# Patient Record
Sex: Male | Born: 1965 | Race: White | Hispanic: No | Marital: Married | Smoking: Never smoker
Health system: Southern US, Community
[De-identification: ages and names within clinical notes are randomized; demographics above are authoritative.]

## PROBLEM LIST (undated history)

## (undated) DIAGNOSIS — K219 Gastro-esophageal reflux disease without esophagitis: Secondary | ICD-10-CM

## (undated) DIAGNOSIS — E785 Hyperlipidemia, unspecified: Secondary | ICD-10-CM

## (undated) DIAGNOSIS — N200 Calculus of kidney: Secondary | ICD-10-CM

## (undated) HISTORY — DX: Gastro-esophageal reflux disease without esophagitis: K21.9

## (undated) HISTORY — DX: Calculus of kidney: N20.0

## (undated) HISTORY — PX: CHOLECYSTECTOMY, LAPAROSCOPIC: SHX56

## (undated) HISTORY — DX: Hyperlipidemia, unspecified: E78.5

---

## 2002-07-15 ENCOUNTER — Ambulatory Visit (HOSPITAL_COMMUNITY): Admission: RE | Admit: 2002-07-15 | Discharge: 2002-07-15 | Payer: Self-pay | Admitting: Family Medicine

## 2002-07-15 ENCOUNTER — Encounter: Payer: Self-pay | Admitting: Family Medicine

## 2002-12-15 ENCOUNTER — Encounter: Payer: Self-pay | Admitting: Gastroenterology

## 2002-12-15 ENCOUNTER — Ambulatory Visit (HOSPITAL_COMMUNITY): Admission: RE | Admit: 2002-12-15 | Discharge: 2002-12-15 | Payer: Self-pay | Admitting: Gastroenterology

## 2007-06-05 ENCOUNTER — Ambulatory Visit: Payer: Self-pay | Admitting: Internal Medicine

## 2007-06-05 LAB — CONVERTED CEMR LAB
Bilirubin Urine: NEGATIVE
Blood in Urine, dipstick: NEGATIVE
Glucose, Urine, Semiquant: NEGATIVE
Ketones, urine, test strip: NEGATIVE
Nitrite: NEGATIVE
Specific Gravity, Urine: 1.025
Urobilinogen, UA: 0.2
WBC Urine, dipstick: NEGATIVE
pH: 6

## 2007-06-09 LAB — CONVERTED CEMR LAB
ALT: 39 units/L (ref 0–53)
AST: 26 units/L (ref 0–37)
Albumin: 4.5 g/dL (ref 3.5–5.2)
Alkaline Phosphatase: 69 units/L (ref 39–117)
BUN: 13 mg/dL (ref 6–23)
Basophils Absolute: 0 10*3/uL (ref 0.0–0.1)
Basophils Relative: 0 % (ref 0.0–1.0)
Bilirubin, Direct: 0.3 mg/dL (ref 0.0–0.3)
CO2: 32 meq/L (ref 19–32)
Calcium: 9.8 mg/dL (ref 8.4–10.5)
Chloride: 104 meq/L (ref 96–112)
Cholesterol: 159 mg/dL (ref 0–200)
Creatinine, Ser: 1 mg/dL (ref 0.4–1.5)
Eosinophils Absolute: 0.1 10*3/uL (ref 0.0–0.6)
Eosinophils Relative: 1.9 % (ref 0.0–5.0)
GFR calc Af Amer: 106 mL/min
GFR calc non Af Amer: 88 mL/min
Glucose, Bld: 98 mg/dL (ref 70–99)
HCT: 47.5 % (ref 39.0–52.0)
HDL: 29.6 mg/dL — ABNORMAL LOW (ref >39.0)
Hemoglobin: 16.6 g/dL (ref 13.0–17.0)
LDL Cholesterol: 107 mg/dL — ABNORMAL HIGH (ref 0–99)
Lymphocytes Relative: 26.5 % (ref 12.0–46.0)
MCHC: 35 g/dL (ref 30.0–36.0)
MCV: 92.7 fL (ref 78.0–100.0)
Monocytes Absolute: 0.4 10*3/uL (ref 0.2–0.7)
Monocytes Relative: 8.3 % (ref 3.0–11.0)
Neutro Abs: 3 10*3/uL (ref 1.4–7.7)
Neutrophils Relative %: 63.3 % (ref 43.0–77.0)
Platelets: 205 10*3/uL (ref 150–400)
Potassium: 4.6 meq/L (ref 3.5–5.1)
RBC: 5.12 M/uL (ref 4.22–5.81)
RDW: 12.4 % (ref 11.5–14.6)
Sodium: 143 meq/L (ref 135–145)
TSH: 1.56 microintl units/mL (ref 0.35–5.50)
Total Bilirubin: 1.3 mg/dL — ABNORMAL HIGH (ref 0.3–1.2)
Total CHOL/HDL Ratio: 5.4
Total Protein: 7.4 g/dL (ref 6.0–8.3)
Triglycerides: 111 mg/dL (ref 0–149)
VLDL: 22 mg/dL (ref 0–40)
WBC: 4.8 10*3/uL (ref 4.5–10.5)

## 2007-06-11 ENCOUNTER — Ambulatory Visit: Payer: Self-pay | Admitting: Internal Medicine

## 2007-06-11 DIAGNOSIS — E785 Hyperlipidemia, unspecified: Secondary | ICD-10-CM | POA: Insufficient documentation

## 2007-06-11 DIAGNOSIS — K219 Gastro-esophageal reflux disease without esophagitis: Secondary | ICD-10-CM | POA: Insufficient documentation

## 2007-07-15 ENCOUNTER — Ambulatory Visit: Payer: Self-pay | Admitting: Internal Medicine

## 2007-07-15 DIAGNOSIS — J029 Acute pharyngitis, unspecified: Secondary | ICD-10-CM | POA: Insufficient documentation

## 2007-07-15 LAB — CONVERTED CEMR LAB: Rapid Strep: NEGATIVE

## 2008-06-16 ENCOUNTER — Ambulatory Visit: Payer: Self-pay | Admitting: Internal Medicine

## 2008-06-16 DIAGNOSIS — M25569 Pain in unspecified knee: Secondary | ICD-10-CM | POA: Insufficient documentation

## 2008-06-16 DIAGNOSIS — M2550 Pain in unspecified joint: Secondary | ICD-10-CM | POA: Insufficient documentation

## 2008-06-16 LAB — CONVERTED CEMR LAB: Anti Nuclear Antibody(ANA): NEGATIVE

## 2008-06-22 ENCOUNTER — Encounter: Payer: Self-pay | Admitting: Internal Medicine

## 2008-06-22 LAB — CONVERTED CEMR LAB
AST: 27 units/L (ref 0–37)
Alkaline Phosphatase: 67 units/L (ref 39–117)
Basophils Absolute: 0 10*3/uL (ref 0.0–0.1)
Lymphocytes Relative: 25.4 % (ref 12.0–46.0)
MCHC: 35.5 g/dL (ref 30.0–36.0)
Neutrophils Relative %: 63 % (ref 43.0–77.0)
RBC: 5.13 M/uL (ref 4.22–5.81)
RDW: 12.1 % (ref 11.5–14.6)
Total Bilirubin: 1.2 mg/dL (ref 0.3–1.2)

## 2008-06-24 ENCOUNTER — Encounter: Payer: Self-pay | Admitting: Internal Medicine

## 2008-08-17 DIAGNOSIS — N200 Calculus of kidney: Secondary | ICD-10-CM

## 2008-08-17 HISTORY — DX: Calculus of kidney: N20.0

## 2008-09-12 ENCOUNTER — Emergency Department (HOSPITAL_COMMUNITY): Admission: EM | Admit: 2008-09-12 | Discharge: 2008-09-12 | Payer: Self-pay | Admitting: Emergency Medicine

## 2008-09-23 ENCOUNTER — Telehealth: Payer: Self-pay | Admitting: Internal Medicine

## 2008-09-26 ENCOUNTER — Emergency Department (HOSPITAL_COMMUNITY): Admission: EM | Admit: 2008-09-26 | Discharge: 2008-09-27 | Payer: Self-pay | Admitting: Emergency Medicine

## 2008-10-02 ENCOUNTER — Encounter: Payer: Self-pay | Admitting: Internal Medicine

## 2009-02-02 ENCOUNTER — Telehealth: Payer: Self-pay | Admitting: Internal Medicine

## 2009-03-19 ENCOUNTER — Telehealth: Payer: Self-pay | Admitting: Internal Medicine

## 2009-04-06 ENCOUNTER — Ambulatory Visit: Payer: Self-pay | Admitting: Internal Medicine

## 2009-04-06 DIAGNOSIS — M25519 Pain in unspecified shoulder: Secondary | ICD-10-CM | POA: Insufficient documentation

## 2009-04-06 DIAGNOSIS — L608 Other nail disorders: Secondary | ICD-10-CM | POA: Insufficient documentation

## 2009-04-07 ENCOUNTER — Encounter: Payer: Self-pay | Admitting: Internal Medicine

## 2009-04-19 ENCOUNTER — Ambulatory Visit: Payer: Self-pay | Admitting: Internal Medicine

## 2009-04-19 LAB — CONVERTED CEMR LAB
Albumin: 4.3 g/dL (ref 3.5–5.2)
Alkaline Phosphatase: 61 units/L (ref 39–117)
Basophils Absolute: 0 10*3/uL (ref 0.0–0.1)
CO2: 32 meq/L (ref 19–32)
Calcium: 9.1 mg/dL (ref 8.4–10.5)
Creatinine, Ser: 1.1 mg/dL (ref 0.4–1.5)
Eosinophils Absolute: 0.1 10*3/uL (ref 0.0–0.7)
Glucose, Bld: 103 mg/dL — ABNORMAL HIGH (ref 70–99)
Glucose, Urine, Semiquant: NEGATIVE
HDL: 33.3 mg/dL — ABNORMAL LOW (ref >39.00)
Hemoglobin: 15.4 g/dL (ref 13.0–17.0)
Ketones, urine, test strip: NEGATIVE
Lymphocytes Relative: 29.8 % (ref 12.0–46.0)
MCHC: 35.9 g/dL (ref 30.0–36.0)
Monocytes Absolute: 0.4 10*3/uL (ref 0.1–1.0)
Neutro Abs: 2.3 10*3/uL (ref 1.4–7.7)
RDW: 12.5 % (ref 11.5–14.6)
Sodium: 147 meq/L — ABNORMAL HIGH (ref 135–145)
Specific Gravity, Urine: >=1.03
TSH: 1.91 microintl units/mL (ref 0.35–5.50)
Triglycerides: 60 mg/dL (ref 0.0–149.0)
pH: 5

## 2009-04-28 ENCOUNTER — Ambulatory Visit: Payer: Self-pay | Admitting: Internal Medicine

## 2009-04-28 DIAGNOSIS — D485 Neoplasm of uncertain behavior of skin: Secondary | ICD-10-CM | POA: Insufficient documentation

## 2009-04-28 DIAGNOSIS — R3129 Other microscopic hematuria: Secondary | ICD-10-CM | POA: Insufficient documentation

## 2009-04-28 DIAGNOSIS — Z87442 Personal history of urinary calculi: Secondary | ICD-10-CM | POA: Insufficient documentation

## 2009-04-28 DIAGNOSIS — H9319 Tinnitus, unspecified ear: Secondary | ICD-10-CM | POA: Insufficient documentation

## 2009-04-28 LAB — CONVERTED CEMR LAB
Glucose, Urine, Semiquant: NEGATIVE
Specific Gravity, Urine: >=1.03
Urobilinogen, UA: 0.2
pH: 5.5

## 2009-06-08 ENCOUNTER — Encounter: Payer: Self-pay | Admitting: Internal Medicine

## 2009-07-13 ENCOUNTER — Encounter: Payer: Self-pay | Admitting: Internal Medicine

## 2010-07-17 LAB — CONVERTED CEMR LAB
Cholesterol, target level: 200 mg/dL
HDL goal, serum: 40 mg/dL
LDL Goal: 160 mg/dL

## 2010-07-19 NOTE — Letter (Signed)
Summary: [facility]  [facility]   Imported By: Maryln Gottron 06/23/2009 14:18:50  _____________________________________________________________________  External Attachment:    Type:   Image     Comment:   External Document

## 2010-07-19 NOTE — Letter (Signed)
Summary: [facility]  [facility]   Imported By: Maryln Gottron 07/15/2009 13:49:45  _____________________________________________________________________  External Attachment:    Type:   Image     Comment:   External Document

## 2010-08-31 ENCOUNTER — Ambulatory Visit (INDEPENDENT_AMBULATORY_CARE_PROVIDER_SITE_OTHER): Payer: 59 | Admitting: Internal Medicine

## 2010-08-31 ENCOUNTER — Encounter: Payer: Self-pay | Admitting: Internal Medicine

## 2010-08-31 VITALS — BP 110/70 | HR 78 | Temp 98.5°F | Wt 201.0 lb

## 2010-08-31 DIAGNOSIS — R1013 Epigastric pain: Secondary | ICD-10-CM

## 2010-08-31 NOTE — Patient Instructions (Signed)
This may be gastritis.  Continue the  prilosec for the full 14 days. If the pain recurs then  Restart and return for evaluation.  If pain is severe we can consider further  Evaluation. Minimize caffeine carbonation and alcohol Gastritis Gastritis is an irritation of the stomach. This is often caused by medications, but can be from anything that bothers the stomach.  Other stomach irritants are:  Alcohol.  Caffeine.   Nicotine.   Spicy or acid foods.   Medications for pain and arthritis. Aspirin and other anti-inflammatory medicines such as ibuprofen (Advil), naproxen (Aleve), and ketoprofen (Orudis) can be highly irritating.  Emotional distress.   Symptoms of gastritis may include:  Abdominal pain.  Indigestion.   Nausea and or vomiting.  Bleeding.   Some patients with chronic gastritis and ulcers have been infected by a germ. They may need special testing. Medications which kill germs can be used to cure this condition. Treatment includes avoiding the substances mentioned above that are known to cause stomach trouble. Medications used to treat gastritis can include:  Antacids.  Medicines to control vomiting.   Acid blocking medicines.   Symptoms of gastritis usually improve within 2-3 days of starting treatment. Call your caregiver if you are not better in a few days. SEEK MEDICAL CARE IF YOU:   Have increased stomach or chest pain.  Vomit blood.   Faint or feel light headed.   Can not keep fluids down.  Pass bloody or black stools.   Develop severe back pain.   MAKE SURE YOU:   Understand these instructions.   Will watch your condition.   Will get help right away if you are not doing well or get worse.  Document Released: 06/05/2005 Document Re-Released: 09/01/2008 [facility] Patient Information 2011 [place], [place].

## 2010-09-03 NOTE — Progress Notes (Signed)
  Subjective:    Patient ID: Roxy Cedar, male    DOB: 11/23/1965, 45 y.o.   MRN: [id number]  HPI Patient comes in today for acute visit with his wife. He was in his usual state of health until about five days ago when he had the onset of what he thought was food poisoning with epigastric pain and discomfort after eating [place] food. He never had any nausea or vomiting or change in bowel habits. However he had continued difficulty with some nausea burping no radiation these symptoms are worse when he lays down and are just about gone when he's in the upright position he tried times without success. He has used Prilosec for three days and is noticed improvement. He does not get the pain when he lays down every time. He denies dysphasia weight-loss fever blood in his stool.  He had a remote history with sounded like reflux symptoms in the past but has not been on medication recently no Advil Aleve type medicines right now.  No family history of gallbladder disease Past Medical History  Diagnosis Date  . Hyperlipidemia   . GERD (gastroesophageal reflux disease)   . Nephrolithiasis 08/2008    left   History reviewed. No pertinent past surgical history.  reports that he has never smoked. He does not have any smokeless tobacco history on file. He reports that he drinks alcohol. He reports that he does not use illicit drugs. family history includes Cancer in his mother. Allergies  Allergen Reactions  . Sulfonamide Derivatives      Review of Systems No fever weight loss night sweats vomiting bruising bleeding cough GU problems  Has decreased caffeine recently     Objective:   Physical Exam This is a well-developed well-nourished healthy appearing in no acute distress HEENT: Normocephalic ;atraumatic , Eyes;  PERRL, EOMs  Full, lids and conjunctiva clear,,Ears: no deformities, canals nl, TM landmarks normal, Nose: no deformity or discharge  Mouth : OP clear without lesion or edema . Neck -  No masses or thyromegaly or limitation in range of motion  Chest:  Clear to A&P without wheezes rales or rhonchi CV:  S1-S2 no gallops or murmurs peripheral perfusion is normal Abdomen:  Sof,t normal bowel sounds without hepatosplenomegaly, no guarding rebound or masses no CVA tenderness points to the epigastrum area of discomfort but no  g or r  Neuro non focal  Ms clear  Skin no acute change     Assessment & Plan:  Abd pain  ? Cause  Unusual with inc with supine position  Poss acid related disease  Discussed at length differential diagnosis and alarm findings because his exam is good today and it is getting some better we will continue on the acid blocking medication and follow-up if not improved. Consider imaging or other valuation if not better. He will continue on a decreased caffeine diet as he is doing and avoid anti-inflammatories.  More than 50% of visit  Was spent in counseling  25 minutes

## 2010-09-04 ENCOUNTER — Emergency Department (HOSPITAL_COMMUNITY)
Admission: EM | Admit: 2010-09-04 | Discharge: 2010-09-05 | Disposition: A | Discharge status: Home / Self Care | Payer: 59 | Attending: Emergency Medicine | Admitting: Emergency Medicine

## 2010-09-04 ENCOUNTER — Emergency Department (HOSPITAL_COMMUNITY): Payer: 59

## 2010-09-04 DIAGNOSIS — R112 Nausea with vomiting, unspecified: Secondary | ICD-10-CM | POA: Insufficient documentation

## 2010-09-04 DIAGNOSIS — R748 Abnormal levels of other serum enzymes: Secondary | ICD-10-CM | POA: Insufficient documentation

## 2010-09-04 DIAGNOSIS — Z87442 Personal history of urinary calculi: Secondary | ICD-10-CM | POA: Insufficient documentation

## 2010-09-04 DIAGNOSIS — K219 Gastro-esophageal reflux disease without esophagitis: Secondary | ICD-10-CM | POA: Insufficient documentation

## 2010-09-04 DIAGNOSIS — R109 Unspecified abdominal pain: Secondary | ICD-10-CM | POA: Insufficient documentation

## 2010-09-04 LAB — CBC
Hemoglobin: 16.8 g/dL (ref 13.0–17.0)
MCH: 31.5 pg (ref 26.0–34.0)
MCHC: 35 g/dL (ref 30.0–36.0)
MCV: 90.1 fL (ref 78.0–100.0)
Platelets: 196 10*3/uL (ref 150–400)
RBC: 5.33 MIL/uL (ref 4.22–5.81)

## 2010-09-04 LAB — DIFFERENTIAL
Basophils Absolute: 0 10*3/uL (ref 0.0–0.1)
Basophils Relative: 0 % (ref 0–1)
Eosinophils Absolute: 0 10*3/uL (ref 0.0–0.7)
Neutro Abs: 6.1 10*3/uL (ref 1.7–7.7)
Neutrophils Relative %: 81 % — ABNORMAL HIGH (ref 43–77)

## 2010-09-04 LAB — COMPREHENSIVE METABOLIC PANEL
BUN: 12 mg/dL (ref 6–23)
CO2: 30 mEq/L (ref 19–32)
Calcium: 9.9 mg/dL (ref 8.4–10.5)
Chloride: 104 mEq/L (ref 96–112)
Creatinine, Ser: 1.06 mg/dL (ref 0.4–1.5)
GFR calc Af Amer: >60 mL/min (ref >60)
GFR calc non Af Amer: >60 mL/min (ref >60)
Total Bilirubin: 2.6 mg/dL — ABNORMAL HIGH (ref 0.3–1.2)

## 2010-09-08 ENCOUNTER — Encounter: Payer: Self-pay | Admitting: Internal Medicine

## 2010-09-08 ENCOUNTER — Ambulatory Visit (INDEPENDENT_AMBULATORY_CARE_PROVIDER_SITE_OTHER): Payer: 59 | Admitting: Internal Medicine

## 2010-09-08 VITALS — BP 130/70 | HR 72 | Wt 199.0 lb

## 2010-09-08 DIAGNOSIS — K8020 Calculus of gallbladder without cholecystitis without obstruction: Secondary | ICD-10-CM

## 2010-09-08 DIAGNOSIS — R1013 Epigastric pain: Secondary | ICD-10-CM

## 2010-09-08 DIAGNOSIS — R7989 Other specified abnormal findings of blood chemistry: Secondary | ICD-10-CM

## 2010-09-08 DIAGNOSIS — K828 Other specified diseases of gallbladder: Secondary | ICD-10-CM | POA: Insufficient documentation

## 2010-09-08 DIAGNOSIS — R945 Abnormal results of liver function studies: Secondary | ICD-10-CM | POA: Insufficient documentation

## 2010-09-08 LAB — POCT URINALYSIS DIPSTICK
Glucose, UA: NEGATIVE
Leukocytes, UA: NEGATIVE
Nitrite, UA: NEGATIVE
Spec Grav, UA: 1.02
Urobilinogen, UA: 1

## 2010-09-08 LAB — HEPATIC FUNCTION PANEL
Alkaline Phosphatase: 124 U/L — ABNORMAL HIGH (ref 39–117)
Bilirubin, Direct: 0.3 mg/dL (ref 0.0–0.3)
Total Bilirubin: 1.5 mg/dL — ABNORMAL HIGH (ref 0.3–1.2)

## 2010-09-08 NOTE — Progress Notes (Signed)
  Subjective:    Patient ID: Kenneth [name], male    DOB: 1966-01-17, 45 y.o.   MRN: [id number]  HPI  Patient comes in with his wife today 4 follow  up from hospital visit for recurrence of this severe epigastric pain. He was seen a week or so ago NB diagnosed him as possible gastritis . Acid peptic disease considering gallbladder disease is a possibility.  At that time he had discomfort only when laying down.   NPT and having increasing persistent severe epigastric pain with nausea over the weekend and eventually was seen in the emergency room on  March 18.   At that time blood work showed a normal CBC normal lipase and chemistry however AST was 599 ALT 813 with a normal alkaline phosphatase and a slightly elevated bili drip. Abdominal ultrasound showed gallbladder sludge and probable small stones but no evidence of acute cholecystitis or biliary ductal dilatation. He was treated with Zofran an IV.at and improved his symptoms. He eventually went home and since that time the pain is better   But he is eating very light and low-fat diet. He has had some constipation since that time but no bleeding.   no known exposures.  Past Medical History  Diagnosis Date  . Hyperlipidemia   . GERD (gastroesophageal reflux disease)   . Nephrolithiasis 08/2008    left   No past surgical history on file.  reports that he has never smoked. He does not have any smokeless tobacco history on file. He reports that he drinks alcohol. He reports that he does not use illicit drugs. family history includes Cancer in his mother. Allergies  Allergen Reactions  . Sulfonamide Derivatives     Review of Systems  negative fever or current vomiting diarrhea blood in stool rash is cough or new symptoms.  Rest of ROS no change    Objective:   Physical Exam  well-developed well-nourished in no acute distress.    Skin nonicteric HEENT normal grossly  Respirations nonlabored cardiac S1-S2 no gallops or murmurs   Abdomen soft  without organomegaly no guarding or rebound today he points to the mid epigastrium   No CVA pain   Reviewed hospital record from a chart    Assessment & Plan:   epigastric pain recurrent acute sludge in the gallbladder and elevated transaminases.   There was no ductal dilatation I still have a concern the gallbladder as the cause of his symptomatology. His pancreas enzymes  lipase were normal. He is not on anti-inflammatories nor any   Hepatotoxin.     For the constipation try white grape juice no high-fiber for now we'll do  Conservative treatment.

## 2010-09-08 NOTE — Patient Instructions (Signed)
Will notify you  of labs when available.  We are going to do a  Gi and surgical referral. IN the meantime stay on meds and avoid fatty foods eat light.

## 2010-09-08 NOTE — Progress Notes (Signed)
Addended by: Rita Ohara on: 09/08/2010 01:38 PM   Modules accepted: Orders

## 2010-09-09 LAB — HEPATITIS PANEL, ACUTE
HCV Ab: NEGATIVE
Hep A IgM: NEGATIVE
Hep B C IgM: NEGATIVE

## 2010-09-12 ENCOUNTER — Telehealth: Payer: Self-pay | Admitting: Gastroenterology

## 2010-09-12 NOTE — Progress Notes (Signed)
Pt's wife aware of results

## 2010-09-12 NOTE — Telephone Encounter (Signed)
Spoke with patients wife and gave her the appointment  For 09/13/10 at 2:30 PM with Willette Cluster, NP.

## 2010-09-13 ENCOUNTER — Encounter: Payer: Self-pay | Admitting: Nurse Practitioner

## 2010-09-13 ENCOUNTER — Ambulatory Visit (INDEPENDENT_AMBULATORY_CARE_PROVIDER_SITE_OTHER): Payer: 59 | Admitting: Nurse Practitioner

## 2010-09-13 ENCOUNTER — Encounter: Payer: Self-pay | Admitting: Gastroenterology

## 2010-09-13 DIAGNOSIS — R945 Abnormal results of liver function studies: Secondary | ICD-10-CM

## 2010-09-13 DIAGNOSIS — K219 Gastro-esophageal reflux disease without esophagitis: Secondary | ICD-10-CM

## 2010-09-13 DIAGNOSIS — R7989 Other specified abnormal findings of blood chemistry: Secondary | ICD-10-CM

## 2010-09-13 DIAGNOSIS — K5900 Constipation, unspecified: Secondary | ICD-10-CM | POA: Insufficient documentation

## 2010-09-13 DIAGNOSIS — R933 Abnormal findings on diagnostic imaging of other parts of digestive tract: Secondary | ICD-10-CM

## 2010-09-13 DIAGNOSIS — R1010 Upper abdominal pain, unspecified: Secondary | ICD-10-CM

## 2010-09-13 DIAGNOSIS — R109 Unspecified abdominal pain: Secondary | ICD-10-CM

## 2010-09-13 MED ORDER — ESOMEPRAZOLE MAGNESIUM 40 MG PO CPDR: 40.0000 mg | DELAYED_RELEASE_CAPSULE | Freq: Every day | ORAL | Status: AC

## 2010-09-13 NOTE — Patient Instructions (Signed)
We have scheduled the Endoscopy with Dr. Russella Dar on 09-16-2010. Directions and brochure provided. We have given you samples of Nexium to try. Take 1 capsule 30 min before breakfast.

## 2010-09-14 ENCOUNTER — Encounter: Payer: Self-pay | Admitting: Nurse Practitioner

## 2010-09-14 NOTE — Assessment & Plan Note (Signed)
Ultrasound negative for evidence of acute cholecystitis or CBD dilation.

## 2010-09-14 NOTE — Progress Notes (Signed)
Kenneth [name] [id number] 1966/03/19   History of Present Illness:  Kenneth [name] is a 45 year old male who, in 2004, was evaluated by Dr. Russella Dar for chest pain and reflux symptoms. He underwent an upper endoscopy which was normal. He has been fairly asymptomatic for the last several years by managing his diet. Kenneth [name] comes in with his wife for evaluation of abdominal pain. Two weeks ago the patient developed epigastric pain radiating through to his back. He went to the emergency department for evaluation where an ultrasound of the abdomen revealed gallbladder sludge and probable small stones without evidence of acute cholecystitis. CBD was normal. CBC was normal. Total bilirubin was elevated at 2.6, AST 599, ALT 813. His alk phos was normal. Repeat LFTs by Dr. Fabian Sharp four days later showed drastic improvement. His bilirubin had declined to 1.5, AST down to 75 and ALT down to 360. Alkaline phosphatase was 12. Patient started on a proton inhibitor and felt better for one week but then had recurrent pain. His upper abdominal pain has been sporadic over the last few days but not nearly as bad as when he went to the emergency department. After receiving narcotics in the emergency room patient had some nausea and vomiting but none prior to or since then.  Past Medical History  Diagnosis Date  . Hyperlipidemia   . GERD (gastroesophageal reflux disease)   . Nephrolithiasis 08/2008    left   History reviewed. No pertinent past surgical history.  reports that he has never smoked. He does not have any smokeless tobacco history on file. He reports that he drinks alcohol. He reports that he does not use illicit drugs. family history includes Cancer in his mother.  There is no history of Colon cancer. Allergies  Allergen Reactions  . Sulfonamide Derivatives Rash    Medications: Prilosec OTC 20mg  daily Pepcid AC 10mg  twice daily   Allergies  Allergen Reactions  . Sulfonamide Derivatives Rash      ROS: All systems reviewed and negative except where noted in the history of present illness.  Physical Exam: General: Well developed white male in no acute distress Head: Normocephalic and atraumatic Eyes:  sclerae anicteric,conjunctive pink. Ears: Normal auditory acuity Mouth: No deformity or lesions Neck: Supple, no masses.  Lungs: Clear throughout to auscultation Heart: Regular rate and rhythm; no murmurs heard Abdomen: Soft, non tender and non distended. No masses or hepatomegaly noted. Normal Bowel sounds Rectal: Deferred Musculoskeletal: Symmetrical with no gross deformities  Skin: No lesions on visible extremities Extremities: No edema or deformities noted Neurological: Alert oriented x 4, grossly nonfocal Cervical Nodes:  No significant cervical adenopathy Psychological:  Alert and cooperative. Normal mood and affect  Assessment and Plan:   .rev

## 2010-09-14 NOTE — Assessment & Plan Note (Signed)
Two week history of constipation. Etiology not clear, he isn't on any pain medications. Trial of Miralax. If constipation persists he will need further evaluation once acute biliary issue resolve.

## 2010-09-14 NOTE — Assessment & Plan Note (Addendum)
Patient has a known history of GERD but had been doing fairly well off medications. Now on a PPI and an H2 blocker. His recent upper abdominal pain probably biliary in nature but it is reasonable to continue a PPI until we prove otherwise. Can stop Pepcid. Samples of Nexium given.

## 2010-09-14 NOTE — Assessment & Plan Note (Addendum)
Intermittent episodes of radiating epigastric pain over the last couple of weeks in setting of gallbladder sludge (possibly small stones), marked transaminitis and,mildly elevated bilirubin (both have improved). Common bile duct was normal. PCP referred patient to [place] as well as surgery. Patient initially felt better after starting a PPI but pain has recurred. My initial thought was to schedule patient for an upper endoscopy to rule out any other possible causes of upper abdominal pain but the overall picture seems most consistent with a passed gallstone so will await surgery's input.

## 2010-09-14 NOTE — Assessment & Plan Note (Addendum)
Improving. Suspect secondary to passed gallstone. Hepatitis A,B, and C studies negative.

## 2010-09-15 NOTE — Progress Notes (Signed)
Probably biliary issue.  I agree with general surgery visit.  He will need lap chole +/- ERCP depending on labs, IOC, clincal course

## 2010-09-16 ENCOUNTER — Other Ambulatory Visit: Payer: 59 | Admitting: Gastroenterology

## 2010-09-28 LAB — URINALYSIS, ROUTINE W REFLEX MICROSCOPIC
Glucose, UA: NEGATIVE mg/dL
Leukocytes, UA: NEGATIVE
Protein, ur: 100 mg/dL — AB
Specific Gravity, Urine: 1.036 — ABNORMAL HIGH (ref 1.005–1.030)
Urobilinogen, UA: 0.2 mg/dL (ref 0.0–1.0)

## 2010-09-28 LAB — DIFFERENTIAL
Eosinophils Absolute: 0 10*3/uL (ref 0.0–0.7)
Lymphs Abs: 0.8 10*3/uL (ref 0.7–4.0)
Neutro Abs: 8.4 10*3/uL — ABNORMAL HIGH (ref 1.7–7.7)
Neutrophils Relative %: 83 % — ABNORMAL HIGH (ref 43–77)

## 2010-09-28 LAB — POCT I-STAT, CHEM 8
Chloride: 104 mEq/L (ref 96–112)
Creatinine, Ser: 1.4 mg/dL (ref 0.4–1.5)
Glucose, Bld: 133 mg/dL — ABNORMAL HIGH (ref 70–99)
Potassium: 4.3 mEq/L (ref 3.5–5.1)

## 2010-09-28 LAB — URINE MICROSCOPIC-ADD ON

## 2010-09-28 LAB — CBC
MCV: 92.7 fL (ref 78.0–100.0)
Platelets: 254 10*3/uL (ref 150–400)
RBC: 4.96 MIL/uL (ref 4.22–5.81)
WBC: 10.1 10*3/uL (ref 4.0–10.5)

## 2010-09-29 LAB — COMPREHENSIVE METABOLIC PANEL
Alkaline Phosphatase: 68 U/L (ref 39–117)
BUN: 14 mg/dL (ref 6–23)
Calcium: 9.1 mg/dL (ref 8.4–10.5)
Creatinine, Ser: 0.87 mg/dL (ref 0.4–1.5)
Glucose, Bld: 103 mg/dL — ABNORMAL HIGH (ref 70–99)
Total Protein: 6.5 g/dL (ref 6.0–8.3)

## 2010-09-29 LAB — DIFFERENTIAL
Basophils Relative: 0 % (ref 0–1)
Lymphs Abs: 0.8 10*3/uL (ref 0.7–4.0)
Monocytes Relative: 9 % (ref 3–12)
Neutro Abs: 5 10*3/uL (ref 1.7–7.7)
Neutrophils Relative %: 79 % — ABNORMAL HIGH (ref 43–77)

## 2010-09-29 LAB — URINE MICROSCOPIC-ADD ON

## 2010-09-29 LAB — URINALYSIS, ROUTINE W REFLEX MICROSCOPIC
Specific Gravity, Urine: 1.026 (ref 1.005–1.030)
Urobilinogen, UA: 0.2 mg/dL (ref 0.0–1.0)

## 2010-09-29 LAB — CBC
HCT: 45.2 % (ref 39.0–52.0)
Hemoglobin: 15.7 g/dL (ref 13.0–17.0)
MCHC: 34.7 g/dL (ref 30.0–36.0)
RDW: 13 % (ref 11.5–15.5)

## 2010-09-30 ENCOUNTER — Other Ambulatory Visit: Payer: Self-pay | Admitting: Surgery

## 2010-09-30 ENCOUNTER — Other Ambulatory Visit (HOSPITAL_COMMUNITY): Payer: 59

## 2010-09-30 ENCOUNTER — Encounter (HOSPITAL_COMMUNITY): Payer: 59

## 2010-09-30 DIAGNOSIS — Z01810 Encounter for preprocedural cardiovascular examination: Secondary | ICD-10-CM | POA: Insufficient documentation

## 2010-09-30 DIAGNOSIS — Z01812 Encounter for preprocedural laboratory examination: Secondary | ICD-10-CM | POA: Insufficient documentation

## 2010-09-30 DIAGNOSIS — Z01818 Encounter for other preprocedural examination: Secondary | ICD-10-CM | POA: Insufficient documentation

## 2010-09-30 LAB — CBC
MCH: 30.8 pg (ref 26.0–34.0)
MCHC: 34.3 g/dL (ref 30.0–36.0)
MCV: 89.9 fL (ref 78.0–100.0)
Platelets: 185 10*3/uL (ref 150–400)
RDW: 12.6 % (ref 11.5–15.5)
WBC: 5.1 10*3/uL (ref 4.0–10.5)

## 2010-09-30 LAB — BASIC METABOLIC PANEL
BUN: 20 mg/dL (ref 6–23)
Calcium: 9.5 mg/dL (ref 8.4–10.5)
Creatinine, Ser: 0.99 mg/dL (ref 0.4–1.5)
GFR calc non Af Amer: >60 mL/min (ref >60)
Potassium: 3.7 mEq/L (ref 3.5–5.1)

## 2010-09-30 LAB — SURGICAL PCR SCREEN: Staphylococcus aureus: NEGATIVE

## 2010-10-07 ENCOUNTER — Other Ambulatory Visit: Payer: Self-pay | Admitting: Surgery

## 2010-10-07 ENCOUNTER — Ambulatory Visit (HOSPITAL_COMMUNITY)
Admission: RE | Admit: 2010-10-07 | Discharge: 2010-10-07 | Disposition: A | Discharge status: Home / Self Care | Payer: 59 | Source: Ambulatory Visit | Attending: Surgery | Admitting: Surgery

## 2010-10-07 ENCOUNTER — Ambulatory Visit (HOSPITAL_COMMUNITY): Payer: 59

## 2010-10-07 DIAGNOSIS — Z01812 Encounter for preprocedural laboratory examination: Secondary | ICD-10-CM | POA: Insufficient documentation

## 2010-10-07 DIAGNOSIS — K219 Gastro-esophageal reflux disease without esophagitis: Secondary | ICD-10-CM | POA: Insufficient documentation

## 2010-10-07 DIAGNOSIS — R1011 Right upper quadrant pain: Secondary | ICD-10-CM | POA: Insufficient documentation

## 2010-10-07 DIAGNOSIS — K8020 Calculus of gallbladder without cholecystitis without obstruction: Secondary | ICD-10-CM | POA: Insufficient documentation

## 2010-10-07 DIAGNOSIS — Z01818 Encounter for other preprocedural examination: Secondary | ICD-10-CM | POA: Insufficient documentation

## 2010-10-12 NOTE — Op Note (Signed)
[name], [name] NO.:  [id number]  MEDICAL RECORD NO.:  [id number]           PATIENT TYPE:  O  LOCATION:  DAYL                         FACILITY:  [facility]  PHYSICIAN:  Abigail Miyamoto, M.D. DATE OF BIRTH:  1966-04-14  DATE OF PROCEDURE:  10/07/2010 DATE OF DISCHARGE:  10/07/2010                              OPERATIVE REPORT   PREOPERATIVE DIAGNOSIS:  Symptomatic cholelithiasis.  POSTOPERATIVE DIAGNOSIS:  Symptomatic cholelithiasis.  PROCEDURE:  Laparoscopic cholecystectomy with intraoperative cholangiogram.  SURGEON:  Abigail Miyamoto, M.D.  ASSISTANT:  Ollen Gross. Vernell Morgans, M.D.  ANESTHESIA:  General endotracheal anesthesia and 0.5% Marcaine.  ESTIMATED BLOOD LOSS:  Minimal.  FINDINGS:  The patient had a chronically scarred appearing gallbladder. Cholangiogram was normal.  PROCEDURE IN DETAIL:  The patient was brought to operative room, identified as Kenneth [name].  He was placed supine on the operative table and general anesthesia was induced.  His abdomen was then prepped and draped in usual sterile fashion.  Using #15 blade, a small vertical incision was made below the umbilicus.  This was carried down to fascia which was then opened with scalpel.  Hemostat was then used to pass to the peritoneal cavity under direct vision.  Next, 0 Vicryl pursestring suture was placed around fascial opening.  The Hasson port was placed through the opening and insufflation of the abdomen was begun.  A 5 mm port was placed in the patient's epigastrium and 2 more in the right upper quadrant, all under direct vision.  The gallbladder was then grasped and tracked above the liver bed.  It was found be thick-walled in appearance.  Multiple adhesions of the gallbladder were taken down bluntly.  The cystic artery was then dissected out and the critical window was achieved around both.  I clipped the artery 3 times proximally, once distally and then clipped the cystic duct  once distally and opened with laparoscopic scissors.  I made a small incision in the right upper quadrant with a scalpel and placed a small cholangiocatheter to this incision under direct vision.  I then placed a cholangiocatheter into the cystic duct.  I performed a cholangiogram with contrast under direct fluoroscopy.  This demonstrated the bile duct and duodenum without evidence of obstructing stone or abnormalities.  At this point, the cholangiocatheter was removed.  I clipped the cystic duct 3 times proximally, then transected it along with the cystic artery.  The gallbladder was then slowly dissected free from liver bed with electrocautery.  Once it was free from liver bed, it was removed through the incision at the umbilicus.  I again examined the liver bed and hemostasis was then achieved with cautery.  I irrigated the abdomen with normal saline.  Again, hemostasis appeared to be achieved.  All ports were then removed under direct vision.  The abdomen was deflated.  All incisions were anesthetized with Marcaine and closed with 4-0 Monocryl subcuticular sutures.  Steri-Strips and Band-Aids were then applied.  The patient tolerated the procedure well.  All counts were correct at the end of procedure.  The patient was then extubated in operating and taken in stable  condition to recovery room.     Abigail Miyamoto, M.D.     DB/MEDQ  D:  10/07/2010  T:  10/07/2010  Job:  [id number]  Electronically Signed by Abigail Miyamoto M.D. on 10/12/2010 11:57:40 AM

## 2011-01-30 ENCOUNTER — Other Ambulatory Visit (INDEPENDENT_AMBULATORY_CARE_PROVIDER_SITE_OTHER): Payer: 59

## 2011-01-30 DIAGNOSIS — Z0000 Encounter for general adult medical examination without abnormal findings: Secondary | ICD-10-CM

## 2011-01-30 LAB — CBC WITH DIFFERENTIAL/PLATELET
Basophils Absolute: 0 10*3/uL (ref 0.0–0.1)
Eosinophils Relative: 1.9 % (ref 0.0–5.0)
Monocytes Relative: 8.8 % (ref 3.0–12.0)
Neutrophils Relative %: 57.3 % (ref 43.0–77.0)
Platelets: 192 10*3/uL (ref 150.0–400.0)
RDW: 13.8 % (ref 11.5–14.6)
WBC: 5 10*3/uL (ref 4.5–10.5)

## 2011-01-30 LAB — LIPID PANEL
LDL Cholesterol: 89 mg/dL (ref 0–99)
Total CHOL/HDL Ratio: 4
Triglycerides: 62 mg/dL (ref 0.0–149.0)
VLDL: 12.4 mg/dL (ref 0.0–40.0)

## 2011-01-30 LAB — POCT URINALYSIS DIPSTICK
Ketones, UA: NEGATIVE
Leukocytes, UA: NEGATIVE
Protein, UA: NEGATIVE
Spec Grav, UA: 1.03
pH, UA: 5.5

## 2011-01-30 LAB — BASIC METABOLIC PANEL
BUN: 21 mg/dL (ref 6–23)
Calcium: 9.3 mg/dL (ref 8.4–10.5)
Creatinine, Ser: 1 mg/dL (ref 0.4–1.5)
GFR: 85.29 mL/min (ref >60.00)

## 2011-01-30 LAB — HEPATIC FUNCTION PANEL
ALT: 29 U/L (ref 0–53)
AST: 20 U/L (ref 0–37)
Bilirubin, Direct: 0.1 mg/dL (ref 0.0–0.3)
Total Bilirubin: 1.2 mg/dL (ref 0.3–1.2)

## 2011-02-06 ENCOUNTER — Ambulatory Visit (INDEPENDENT_AMBULATORY_CARE_PROVIDER_SITE_OTHER): Payer: 59 | Admitting: Internal Medicine

## 2011-02-06 ENCOUNTER — Encounter: Payer: Self-pay | Admitting: Internal Medicine

## 2011-02-06 VITALS — BP 110/60 | HR 72 | Ht 70.0 in | Wt 185.0 lb

## 2011-02-06 DIAGNOSIS — R3129 Other microscopic hematuria: Secondary | ICD-10-CM

## 2011-02-06 DIAGNOSIS — E785 Hyperlipidemia, unspecified: Secondary | ICD-10-CM

## 2011-02-06 DIAGNOSIS — L608 Other nail disorders: Secondary | ICD-10-CM

## 2011-02-06 DIAGNOSIS — H9319 Tinnitus, unspecified ear: Secondary | ICD-10-CM

## 2011-02-06 DIAGNOSIS — Z0000 Encounter for general adult medical examination without abnormal findings: Secondary | ICD-10-CM | POA: Insufficient documentation

## 2011-02-06 NOTE — Patient Instructions (Signed)
Your  Lipids are much better . Should be ok to  Take antifungal meds if appropriate at this time. Follow through with the eval for blood in urine Ear protection at work is eimportant . Continue lifestyle intervention healthy eating and exercise .

## 2011-02-06 NOTE — Progress Notes (Signed)
  Subjective:    Patient ID: Kenneth [name], male    DOB: [birth year], 45 y.o.   MRN: [id number]  HPI Patient comes in today for preventive visit and follow-up of medical issues. Update of  history since last visit. Totally better  After choly  No major illness now .   Tinnitus almost chronic  [employer]  works evenings  Uses ear plugs no monitoring of hearing at work   Thickened nail left  Ring  Told it is fungus infection    Review of Systems ROS:  GEN/ HEENTNo fever, significant weight changes sweats headaches vision problems hearing changes, CV/ PULM; No chest pain shortness of breath cough, syncope,edema  change in exercise tolerance. GI /GU: No adominal pain, vomiting, change in bowel habits. No blood in the stool. No significant GU symptoms. SKIN/HEME: ,no acute skin rashes suspicious lesions or bleeding. No lymphadenopathy, nodules, masses.  NEURO/ PSYCH:  No neurologic signs such as weakness numbness No depression anxiety. IMM/ Allergy: No unusual infections.  Allergy .   No left thumg tender with flexion nl function and no injury REST of 12 system review negative or as  Per hpi      Objective:   Physical Exam Physical Exam: Vital signs reviewed WUJ:WJXB is a well-developed well-nourished alert cooperative  White male  who appears   stated age in no acute distress.  HEENT: normocephalic  traumatic , Eyes: PERRL EOM's full, conjunctiva clear, Nares: patent no deformity discharge or tenderness., Ears: no deformity EAC's clear TMs with normal landmarks. Mouth: clear OP, no lesions, edema.  Moist mucous membranes. Dentition in adequate repair. NECK: supple without masses, thyromegaly or bruits. CHEST/PULM:  Clear to auscultation and percussion breath sounds equal no wheeze , rales or rhonchi. No chest wall deformities or tenderness. CV: PMI is nondisplaced, S1 S2 no gallops, murmurs, rubs. Peripheral pulses are full without delay.No JVD .  ABDOMEN: Bowel sounds normal  nontender  No guard or rebound, no hepato splenomegal no CVA tenderness.  No hernia. Extremtities:  No clubbing cyanosis or edema, no acute joint swelling or redness no focal atrophy   NEURO:  Oriented x3, cranial nerves 3-12 appear to be intact, no obvious focal weakness,gait within normal limits no abnormal reflexes or asymmetrical SKIN: No acute rashes normal turgor, color, no bruising or petechiae. Sun changes .   Left nail ring   Thickened .      PSYCH: Oriented, good eye contact, no obvious depression anxiety, cognition and judgment appear normal. LN:  No cervical axillary or inguinal adenopathy Labs reviewed with patient.      Assessment & Plan:  Preventive Health Care Counseled regarding healthy nutrition, exercise, sleep, injury prevention, calcium vit d and healthy weight . LIPID  Much better  Microscopic hematuria better  Under eval hx of stone also.  Onychomycosis  Was holding off on meds cause of lft abnormality but this resolved and was related to biliary disease Thumb right   Poss overuse  Vs OA  Tinnitus .    Job with noise exposure Counseled. About ear protection  Hearing  Screen ok today

## 2011-02-11 ENCOUNTER — Encounter: Payer: Self-pay | Admitting: Internal Medicine

## 2013-02-18 ENCOUNTER — Telehealth: Payer: Self-pay | Admitting: Internal Medicine

## 2013-02-18 NOTE — Telephone Encounter (Signed)
PT wife called and stated she would like to schedule her husbands physical, and it needs to be completed prior to the end of October. She states that his insurance is requiring that it's completed by then. Please assist.

## 2013-02-20 NOTE — Telephone Encounter (Signed)
Ok to use a Tuesday or a 12 noon appt on a Wed.  Please look at the Tues schedule to make sure there are no other 30 minute appointments that this will be scheduled next to.

## 2013-02-25 NOTE — Telephone Encounter (Signed)
lmovm / ga

## 2013-03-12 ENCOUNTER — Other Ambulatory Visit (INDEPENDENT_AMBULATORY_CARE_PROVIDER_SITE_OTHER): Payer: 59

## 2013-03-12 DIAGNOSIS — Z0000 Encounter for general adult medical examination without abnormal findings: Secondary | ICD-10-CM

## 2013-03-12 LAB — HEPATIC FUNCTION PANEL
ALT: 74 U/L — ABNORMAL HIGH (ref 0–53)
Alkaline Phosphatase: 74 U/L (ref 39–117)
Bilirubin, Direct: 0.1 mg/dL (ref 0.0–0.3)
Total Bilirubin: 0.9 mg/dL (ref 0.3–1.2)
Total Protein: 6.9 g/dL (ref 6.0–8.3)

## 2013-03-12 LAB — CBC WITH DIFFERENTIAL/PLATELET
Eosinophils Absolute: 0.1 10*3/uL (ref 0.0–0.7)
HCT: 44.8 % (ref 39.0–52.0)
Lymphs Abs: 1.4 10*3/uL (ref 0.7–4.0)
MCHC: 33.9 g/dL (ref 30.0–36.0)
MCV: 92.4 fl (ref 78.0–100.0)
Monocytes Absolute: 0.6 10*3/uL (ref 0.1–1.0)
Neutrophils Relative %: 60.3 % (ref 43.0–77.0)
Platelets: 190 10*3/uL (ref 150.0–400.0)

## 2013-03-12 LAB — LIPID PANEL
Cholesterol: 150 mg/dL (ref 0–200)
Total CHOL/HDL Ratio: 4
Triglycerides: 78 mg/dL (ref 0.0–149.0)

## 2013-03-12 LAB — BASIC METABOLIC PANEL
BUN: 19 mg/dL (ref 6–23)
CO2: 28 mEq/L (ref 19–32)
Chloride: 108 mEq/L (ref 96–112)
Creatinine, Ser: 1.1 mg/dL (ref 0.4–1.5)
Glucose, Bld: 95 mg/dL (ref 70–99)

## 2013-03-18 ENCOUNTER — Encounter: Payer: Self-pay | Admitting: Internal Medicine

## 2013-03-18 ENCOUNTER — Ambulatory Visit (INDEPENDENT_AMBULATORY_CARE_PROVIDER_SITE_OTHER): Payer: 59 | Admitting: Internal Medicine

## 2013-03-18 VITALS — BP 120/72 | HR 94 | Temp 98.4°F | Ht 70.0 in | Wt 192.0 lb

## 2013-03-18 DIAGNOSIS — Z0000 Encounter for general adult medical examination without abnormal findings: Secondary | ICD-10-CM | POA: Insufficient documentation

## 2013-03-18 DIAGNOSIS — R7989 Other specified abnormal findings of blood chemistry: Secondary | ICD-10-CM

## 2013-03-18 DIAGNOSIS — L409 Psoriasis, unspecified: Secondary | ICD-10-CM

## 2013-03-18 DIAGNOSIS — R945 Abnormal results of liver function studies: Secondary | ICD-10-CM

## 2013-03-18 DIAGNOSIS — L408 Other psoriasis: Secondary | ICD-10-CM

## 2013-03-18 NOTE — Progress Notes (Signed)
Chief Complaint  Patient presents with  . Annual Exam    HPI: Patient comes in today for Preventive Health Care visit  Has form for work  No major changes in health Sleep  Works till 1 am   Gets about 7-8 hours  etoh minimal  caffiene  2 per day   With sugar.    ROS:  GEN/ HEENT: No fever, significant weight changes sweats headaches vision problems hearing changes, CV/ PULM; No chest pain shortness of breath cough, syncope,edema  change in exercise tolerance. GI /GU: No adominal pain, vomiting, change in bowel habits. No blood in the stool. No significant GU symptoms. SKIN/HEME: ,no acute skin rashes suspicious lesions or bleeding. No lymphadenopathy, nodules, masses. Sees derm  For skin condition  ? Psoriasis  NEURO/ PSYCH:  No neurologic signs such as weakness numbness. No depression anxiety. IMM/ Allergy: No unusual infections.  Allergy .   REST of 12 system review negative except as per HPIRight   Buttocks twinges  electric.   No worse with exerercise.  Better  After  Moving wallet  No radiation no systemic sx   Past Medical History  Diagnosis Date  . Hyperlipidemia   . GERD (gastroesophageal reflux disease)   . Nephrolithiasis 08/2008    left    Family History  Problem Relation Age of Onset  . Cancer Mother     eye  . Colon cancer Neg Hx     History   Social History  . Marital Status: Married    Spouse Name: N/A    Number of Children: N/A  . Years of Education: N/A   Social History Main Topics  . Smoking status: Never Smoker   . Smokeless tobacco: None  . Alcohol Use: Yes     Comment: socially; currently none due to symptoms  . Drug Use: No  . Sexual Activity: None   Other Topics Concern  . 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 clobetasol ointment (TEMOVATE) 0.05 %        No facility-administered  encounter medications on file as of 03/18/2013.    EXAM:  BP 120/72  Pulse 94  Temp(Src) 98.4 F (36.9 C) (Oral)  Ht 5\' 10"  (1.778 m)  Wt 192 lb (87.091 kg)  BMI 27.55 kg/m2  SpO2 97%  Body mass index is 27.55 kg/(m^2).  Physical Exam: Vital signs reviewed MWU:XLKG is a well-developed well-nourished alert cooperative   male who appears stated age in no acute distress.  HEENT: normocephalic atraumatic , Eyes: PERRL EOM's full, conjunctiva clear, Nares: paten,t no deformity discharge or tenderness., Ears: no deformity EAC's clear TMs with normal landmarks. Mouth: clear OP, no lesions, edema.  Moist mucous membranes. Dentition in adequate repair. NECK: supple without masses, thyromegaly or bruits. CHEST/PULM:  Clear to auscultation and percussion breath sounds equal no wheeze , rales or rhonchi. No chest wall deformities or tenderness. CV: PMI is nondisplaced, S1 S2 no gallops, murmurs, rubs. Peripheral pulses are full without delay.No JVD .  ABDOMEN: Bowel sounds normal nontender  No guard or rebound, no hepato splenomegal no CVA tenderness.  No hernia. Extremtities:  No clubbing cyanosis or edema, no acute joint swelling or redness no focal atrophy NEURO:  Oriented x3, cranial nerves 3-12 appear to be intact, no obvious focal weakness,gait within normal  limits no abnormal reflexes or asymmetrical SKIN: No acute rashes normal turgor, color, no bruising or petechiae. Nails some deformity  PSYCH: Oriented, good eye contact, no obvious depression anxiety, cognition and judgment appear normal. LN: no cervical axillary inguinal adenopathy  Lab Results  Component Value Date   WBC 5.2 03/12/2013   HGB 15.2 03/12/2013   HCT 44.8 03/12/2013   PLT 190.0 03/12/2013   GLUCOSE 95 03/12/2013   CHOL 150 03/12/2013   TRIG 78.0 03/12/2013   HDL 39.40 03/12/2013   LDLCALC 95 03/12/2013   ALT 74* 03/12/2013   AST 45* 03/12/2013   NA 140 03/12/2013   K 4.3 03/12/2013   CL 108 03/12/2013   CREATININE 1.1  03/12/2013   BUN 19 03/12/2013   CO2 28 03/12/2013   TSH 2.19 03/12/2013    ASSESSMENT AND PLAN:  Discussed the following assessment and plan:  Visit for preventive health examination - declined flu vaccine today  utd otherwise  Abnormal LFTs  Psoriasis serologys done whe had cholecy  No sx had oral med a year ago for nails Recheck and if  persistent or progressive more evaluation etc  Form signed  For work  Patient Care Team: Madelin Headings, MD as PCP - General Meryl Dare, MD (Gastroenterology) Ellen Henri (Dermatology) Patient Instructions  Continue lifestyle intervention healthy eating and exercise . Avoid alcohol/ tylenol /  advil   Check LFTs in one to 2 months ( no ov needed)   Plan follow up if needed.   Preventive Care for Adults, Male A healthy lifestyle and preventive care can promote health and wellness. Preventive health guidelines for men include the following key practices:  A routine yearly physical is a good way to check with your caregiver about your health and preventative screening. It is a chance to share any concerns and updates on your health, and to receive a thorough exam.  Visit your dentist for a routine exam and preventative care every 6 months. Brush your teeth twice a day and floss once a day. Good oral hygiene prevents tooth decay and gum disease.  The frequency of eye exams is based on your age, health, family medical history, use of contact lenses, and other factors. Follow your caregiver's recommendations for frequency of eye exams.  Eat a healthy diet. Foods like vegetables, fruits, whole grains, low-fat dairy products, and lean protein foods contain the nutrients you need without too many calories. Decrease your intake of foods high in solid fats, added sugars, and salt. Eat the right amount of calories for you.Get information about a proper diet from your caregiver, if necessary.  Regular physical exercise is one of the most important  things you can do for your health. Most adults should get at least 150 minutes of moderate-intensity exercise (any activity that increases your heart rate and causes you to sweat) each week. In addition, most adults need muscle-strengthening exercises on 2 or more days a week.  Maintain a healthy weight. The body mass index (BMI) is a screening tool to identify possible weight problems. It provides an estimate of body fat based on height and weight. Your caregiver can help determine your BMI, and can help you achieve or maintain a healthy weight.For adults 20 years and older:  A BMI below 18.5 is considered underweight.  A BMI of 18.5 to 24.9 is normal.  A BMI of 25 to 29.9 is considered overweight.  A BMI of 30 and above is considered obese.  Maintain normal  blood lipids and cholesterol levels by exercising and minimizing your intake of saturated fat. Eat a balanced diet with plenty of fruit and vegetables. Blood tests for lipids and cholesterol should begin at age 12 and be repeated every 5 years. If your lipid or cholesterol levels are high, you are over 50, or you are a high risk for heart disease, you may need your cholesterol levels checked more frequently.Ongoing high lipid and cholesterol levels should be treated with medicines if diet and exercise are not effective.  If you smoke, find out from your caregiver how to quit. If you do not use tobacco, do not start.  If you choose to drink alcohol, do not exceed 2 drinks per day. One drink is considered to be 12 ounces (355 mL) of beer, 5 ounces (148 mL) of wine, or 1.5 ounces (44 mL) of liquor.  Avoid use of street drugs. Do not share needles with anyone. Ask for help if you need support or instructions about stopping the use of drugs.  High blood pressure causes heart disease and increases the risk of stroke. Your blood pressure should be checked at least every 1 to 2 years. Ongoing high blood pressure should be treated with medicines,  if weight loss and exercise are not effective.  If you are 50 to 46 years old, ask your caregiver if you should take aspirin to prevent heart disease.  Diabetes screening involves taking a blood sample to check your fasting blood sugar level. This should be done once every 3 years, after age 84, if you are within normal weight and without risk factors for diabetes. Testing should be considered at a younger age or be carried out more frequently if you are overweight and have at least 1 risk factor for diabetes.  Colorectal cancer can be detected and often prevented. Most routine colorectal cancer screening begins at the age of 49 and continues through age 55. However, your caregiver may recommend screening at an earlier age if you have risk factors for colon cancer. On a yearly basis, your caregiver may provide home test kits to check for hidden blood in the stool. Use of a small camera at the end of a tube, to directly examine the colon (sigmoidoscopy or colonoscopy), can detect the earliest forms of colorectal cancer. Talk to your caregiver about this at age 66, when routine screening begins. Direct examination of the colon should be repeated every 5 to 10 years through age 80, unless early forms of pre-cancerous polyps or small growths are found.  Hepatitis C blood testing is recommended for all people born from [birth year] through 1965 and any individual with known risks for hepatitis C.  Practice safe sex. Use condoms and avoid high-risk sexual practices to reduce the spread of sexually transmitted infections (STIs). STIs include gonorrhea, chlamydia, syphilis, trichomonas, herpes, HPV, and human immunodeficiency virus (HIV). Herpes, HIV, and HPV are viral illnesses that have no cure. They can result in disability, cancer, and death.  A one-time screening for abdominal aortic aneurysm (AAA) and surgical repair of large AAAs by sound wave imaging (ultrasonography) is recommended for ages 52 to 61 years who  are current or former smokers.  Healthy men should no longer receive prostate-specific antigen (PSA) blood tests as part of routine cancer screening. Consult with your caregiver about prostate cancer screening.  Testicular cancer screening is not recommended for adult males who have no symptoms. Screening includes self-exam, caregiver exam, and other screening tests. Consult with your caregiver about any  symptoms you have or any concerns you have about testicular cancer.  Use sunscreen with skin protection factor (SPF) of 30 or more. Apply sunscreen liberally and repeatedly throughout the day. You should seek shade when your shadow is shorter than you. Protect yourself by wearing long sleeves, pants, a wide-brimmed hat, and sunglasses year round, whenever you are outdoors.  Once a month, do a whole body skin exam, using a mirror to look at the skin on your back. Notify your caregiver of new moles, moles that have irregular borders, moles that are larger than a pencil eraser, or moles that have changed in shape or color.  Stay current with required immunizations.  Influenza. You need a dose every fall (or winter). The composition of the flu vaccine changes each year, so being vaccinated once is not enough.  Pneumococcal polysaccharide. You need 1 to 2 doses if you smoke cigarettes or if you have certain chronic medical conditions. You need 1 dose at age 39 (or older) if you have never been vaccinated.  Tetanus, diphtheria, pertussis (Tdap, Td). Get 1 dose of Tdap vaccine if you are younger than age 32 years, are over 44 and have contact with an infant, are a Research scientist (physical sciences), or simply want to be protected from whooping cough. After that, you need a Td booster dose every 10 years. Consult your caregiver if you have not had at least 3 tetanus and diphtheria-containing shots sometime in your life or have a deep or dirty wound.  HPV. This vaccine is recommended for males 13 through 46 years of age.  This vaccine may be given to men 22 through 46 years of age who have not completed the 3 dose series. It is recommended for men through age 59 who have sex with men or whose immune system is weakened because of HIV infection, other illness, or medications. The vaccine is given in 3 doses over 6 months.  Measles, mumps, rubella (MMR). You need at least 1 dose of MMR if you were born in 1957 or later. You may also need a 2nd dose.  Meningococcal. If you are age 24 to 17 years and a Orthoptist living in a residence hall, or have one of several medical conditions, you need to get vaccinated against meningococcal disease. You may also need additional booster doses.  Zoster (shingles). If you are age 100 years or older, you should get this vaccine.  Varicella (chickenpox). If you have never had chickenpox or you were vaccinated but received only 1 dose, talk to your caregiver to find out if you need this vaccine.  Hepatitis A. You need this vaccine if you have a specific risk factor for hepatitis A virus infection, or you simply wish to be protected from this disease. The vaccine is usually given as 2 doses, 6 to 18 months apart.  Hepatitis B. You need this vaccine if you have a specific risk factor for hepatitis B virus infection or you simply wish to be protected from this disease. The vaccine is given in 3 doses, usually over 6 months. Preventative Service / Frequency Ages 28 to 31  Blood pressure check.** / Every 1 to 2 years.  Lipid and cholesterol check.** / Every 5 years beginning at age 37.  Hepatitis C blood test.** / For any individual with known risks for hepatitis C.  Skin self-exam. / Monthly.  Influenza immunization.** / Every year.  Pneumococcal polysaccharide immunization.** / 1 to 2 doses if you smoke cigarettes or if you  have certain chronic medical conditions.  Tetanus, diphtheria, pertussis (Tdap,Td) immunization. / A one-time dose of Tdap vaccine. After that,  you need a Td booster dose every 10 years.  HPV immunization. / 3 doses over 6 months, if 26 and younger.  Measles, mumps, rubella (MMR) immunization. / You need at least 1 dose of MMR if you were born in 1957 or later. You may also need a 2nd dose.  Meningococcal immunization. / 1 dose if you are age 48 to 62 years and a Orthoptist living in a residence hall, or have one of several medical conditions, you need to get vaccinated against meningococcal disease. You may also need additional booster doses.  Varicella immunization.** / Consult your caregiver.  Hepatitis A immunization.** / Consult your caregiver. 2 doses, 6 to 18 months apart.  Hepatitis B immunization.** / Consult your caregiver. 3 doses usually over 6 months. Ages 45 to 50  Blood pressure check.** / Every 1 to 2 years.  Lipid and cholesterol check.** / Every 5 years beginning at age 58.  Fecal occult blood test (FOBT) of stool. / Every year beginning at age 66 and continuing until age 74. You may not have to do this test if you get colonoscopy every 10 years.  Flexible sigmoidoscopy** or colonoscopy.** / Every 5 years for a flexible sigmoidoscopy or every 10 years for a colonoscopy beginning at age 8 and continuing until age 72.  Hepatitis C blood test.** / For all people born from [birth year] through 1965 and any individual with known risks for hepatitis C.  Skin self-exam. / Monthly.  Influenza immunization.** / Every year.  Pneumococcal polysaccharide immunization.** / 1 to 2 doses if you smoke cigarettes or if you have certain chronic medical conditions.  Tetanus, diphtheria, pertussis (Tdap/Td) immunization.** / A one-time dose of Tdap vaccine. After that, you need a Td booster dose every 10 years.  Measles, mumps, rubella (MMR) immunization. / You need at least 1 dose of MMR if you were born in 1957 or later. You may also need a 2nd dose.  Varicella immunization.**/ Consult your  caregiver.  Meningococcal immunization.** / Consult your caregiver.  Hepatitis A immunization.** / Consult your caregiver. 2 doses, 6 to 18 months apart.  Hepatitis B immunization.** / Consult your caregiver. 3 doses, usually over 6 months. Ages 75 and over  Blood pressure check.** / Every 1 to 2 years.  Lipid and cholesterol check.**/ Every 5 years beginning at age 17.  Fecal occult blood test (FOBT) of stool. / Every year beginning at age 37 and continuing until age 75. You may not have to do this test if you get colonoscopy every 10 years.  Flexible sigmoidoscopy** or colonoscopy.** / Every 5 years for a flexible sigmoidoscopy or every 10 years for a colonoscopy beginning at age 13 and continuing until age 104.  Hepatitis C blood test.** / For all people born from [birth year] through 1964 and any individual with known risks for hepatitis C.  Abdominal aortic aneurysm (AAA) screening.** / A one-time screening for ages 56 to 25 years who are current or former smokers.  Skin self-exam. / Monthly.  Influenza immunization.** / Every year.  Pneumococcal polysaccharide immunization.** / 1 dose at age 74 (or older) if you have never been vaccinated.  Tetanus, diphtheria, pertussis (Tdap, Td) immunization. / A one-time dose of Tdap vaccine if you are over 65 and have contact with an infant, are a Research scientist (physical sciences), or simply want to be protected from whooping cough.  After that, you need a Td booster dose every 10 years.  Varicella immunization. ** / Consult your caregiver.  Meningococcal immunization.** / Consult your caregiver.  Hepatitis A immunization. ** / Consult your caregiver. 2 doses, 6 to 18 months apart.  Hepatitis B immunization.** / Check with your caregiver. 3 doses, usually over 6 months. **Family history and personal history of risk and conditions may change your caregiver's recommendations. Document Released: 08/01/2001 Document Revised: 08/28/2011 Document Reviewed:  10/31/2010 [facility] Patient Information 2014 [place], [place].     Neta Mends. Panosh M.D.

## 2013-03-18 NOTE — Patient Instructions (Signed)
Continue lifestyle intervention healthy eating and exercise . Avoid alcohol/ tylenol /  advil   Check LFTs in one to 2 months ( no ov needed)   Plan follow up if needed.   Preventive Care for Adults, Male A healthy lifestyle and preventive care can promote health and wellness. Preventive health guidelines for men include the following key practices:  A routine yearly physical is a good way to check with your caregiver about your health and preventative screening. It is a chance to share any concerns and updates on your health, and to receive a thorough exam.  Visit your dentist for a routine exam and preventative care every 6 months. Brush your teeth twice a day and floss once a day. Good oral hygiene prevents tooth decay and gum disease.  The frequency of eye exams is based on your age, health, family medical history, use of contact lenses, and other factors. Follow your caregiver's recommendations for frequency of eye exams.  Eat a healthy diet. Foods like vegetables, fruits, whole grains, low-fat dairy products, and lean protein foods contain the nutrients you need without too many calories. Decrease your intake of foods high in solid fats, added sugars, and salt. Eat the right amount of calories for you.Get information about a proper diet from your caregiver, if necessary.  Regular physical exercise is one of the most important things you can do for your health. Most adults should get at least 150 minutes of moderate-intensity exercise (any activity that increases your heart rate and causes you to sweat) each week. In addition, most adults need muscle-strengthening exercises on 2 or more days a week.  Maintain a healthy weight. The body mass index (BMI) is a screening tool to identify possible weight problems. It provides an estimate of body fat based on height and weight. Your caregiver can help determine your BMI, and can help you achieve or maintain a healthy weight.For adults 20 years and  older:  A BMI below 18.5 is considered underweight.  A BMI of 18.5 to 24.9 is normal.  A BMI of 25 to 29.9 is considered overweight.  A BMI of 30 and above is considered obese.  Maintain normal blood lipids and cholesterol levels by exercising and minimizing your intake of saturated fat. Eat a balanced diet with plenty of fruit and vegetables. Blood tests for lipids and cholesterol should begin at age 25 and be repeated every 5 years. If your lipid or cholesterol levels are high, you are over 50, or you are a high risk for heart disease, you may need your cholesterol levels checked more frequently.Ongoing high lipid and cholesterol levels should be treated with medicines if diet and exercise are not effective.  If you smoke, find out from your caregiver how to quit. If you do not use tobacco, do not start.  If you choose to drink alcohol, do not exceed 2 drinks per day. One drink is considered to be 12 ounces (355 mL) of beer, 5 ounces (148 mL) of wine, or 1.5 ounces (44 mL) of liquor.  Avoid use of street drugs. Do not share needles with anyone. Ask for help if you need support or instructions about stopping the use of drugs.  High blood pressure causes heart disease and increases the risk of stroke. Your blood pressure should be checked at least every 1 to 2 years. Ongoing high blood pressure should be treated with medicines, if weight loss and exercise are not effective.  If you are 45 to 46 years  old, ask your caregiver if you should take aspirin to prevent heart disease.  Diabetes screening involves taking a blood sample to check your fasting blood sugar level. This should be done once every 3 years, after age 4, if you are within normal weight and without risk factors for diabetes. Testing should be considered at a younger age or be carried out more frequently if you are overweight and have at least 1 risk factor for diabetes.  Colorectal cancer can be detected and often prevented.  Most routine colorectal cancer screening begins at the age of 69 and continues through age 48. However, your caregiver may recommend screening at an earlier age if you have risk factors for colon cancer. On a yearly basis, your caregiver may provide home test kits to check for hidden blood in the stool. Use of a small camera at the end of a tube, to directly examine the colon (sigmoidoscopy or colonoscopy), can detect the earliest forms of colorectal cancer. Talk to your caregiver about this at age 60, when routine screening begins. Direct examination of the colon should be repeated every 5 to 10 years through age 6, unless early forms of pre-cancerous polyps or small growths are found.  Hepatitis C blood testing is recommended for all people born from [birth year] through 1965 and any individual with known risks for hepatitis C.  Practice safe sex. Use condoms and avoid high-risk sexual practices to reduce the spread of sexually transmitted infections (STIs). STIs include gonorrhea, chlamydia, syphilis, trichomonas, herpes, HPV, and human immunodeficiency virus (HIV). Herpes, HIV, and HPV are viral illnesses that have no cure. They can result in disability, cancer, and death.  A one-time screening for abdominal aortic aneurysm (AAA) and surgical repair of large AAAs by sound wave imaging (ultrasonography) is recommended for ages 60 to 89 years who are current or former smokers.  Healthy men should no longer receive prostate-specific antigen (PSA) blood tests as part of routine cancer screening. Consult with your caregiver about prostate cancer screening.  Testicular cancer screening is not recommended for adult males who have no symptoms. Screening includes self-exam, caregiver exam, and other screening tests. Consult with your caregiver about any symptoms you have or any concerns you have about testicular cancer.  Use sunscreen with skin protection factor (SPF) of 30 or more. Apply sunscreen liberally and  repeatedly throughout the day. You should seek shade when your shadow is shorter than you. Protect yourself by wearing long sleeves, pants, a wide-brimmed hat, and sunglasses year round, whenever you are outdoors.  Once a month, do a whole body skin exam, using a mirror to look at the skin on your back. Notify your caregiver of new moles, moles that have irregular borders, moles that are larger than a pencil eraser, or moles that have changed in shape or color.  Stay current with required immunizations.  Influenza. You need a dose every fall (or winter). The composition of the flu vaccine changes each year, so being vaccinated once is not enough.  Pneumococcal polysaccharide. You need 1 to 2 doses if you smoke cigarettes or if you have certain chronic medical conditions. You need 1 dose at age 89 (or older) if you have never been vaccinated.  Tetanus, diphtheria, pertussis (Tdap, Td). Get 1 dose of Tdap vaccine if you are younger than age 61 years, are over 3 and have contact with an infant, are a Research scientist (physical sciences), or simply want to be protected from whooping cough. After that, you need a Td booster  dose every 10 years. Consult your caregiver if you have not had at least 3 tetanus and diphtheria-containing shots sometime in your life or have a deep or dirty wound.  HPV. This vaccine is recommended for males 13 through 46 years of age. This vaccine may be given to men 22 through 46 years of age who have not completed the 3 dose series. It is recommended for men through age 34 who have sex with men or whose immune system is weakened because of HIV infection, other illness, or medications. The vaccine is given in 3 doses over 6 months.  Measles, mumps, rubella (MMR). You need at least 1 dose of MMR if you were born in 1957 or later. You may also need a 2nd dose.  Meningococcal. If you are age 41 to 25 years and a Orthoptist living in a residence hall, or have one of several medical  conditions, you need to get vaccinated against meningococcal disease. You may also need additional booster doses.  Zoster (shingles). If you are age 74 years or older, you should get this vaccine.  Varicella (chickenpox). If you have never had chickenpox or you were vaccinated but received only 1 dose, talk to your caregiver to find out if you need this vaccine.  Hepatitis A. You need this vaccine if you have a specific risk factor for hepatitis A virus infection, or you simply wish to be protected from this disease. The vaccine is usually given as 2 doses, 6 to 18 months apart.  Hepatitis B. You need this vaccine if you have a specific risk factor for hepatitis B virus infection or you simply wish to be protected from this disease. The vaccine is given in 3 doses, usually over 6 months. Preventative Service / Frequency Ages 74 to 82  Blood pressure check.** / Every 1 to 2 years.  Lipid and cholesterol check.** / Every 5 years beginning at age 49.  Hepatitis C blood test.** / For any individual with known risks for hepatitis C.  Skin self-exam. / Monthly.  Influenza immunization.** / Every year.  Pneumococcal polysaccharide immunization.** / 1 to 2 doses if you smoke cigarettes or if you have certain chronic medical conditions.  Tetanus, diphtheria, pertussis (Tdap,Td) immunization. / A one-time dose of Tdap vaccine. After that, you need a Td booster dose every 10 years.  HPV immunization. / 3 doses over 6 months, if 26 and younger.  Measles, mumps, rubella (MMR) immunization. / You need at least 1 dose of MMR if you were born in 1957 or later. You may also need a 2nd dose.  Meningococcal immunization. / 1 dose if you are age 38 to 4 years and a Orthoptist living in a residence hall, or have one of several medical conditions, you need to get vaccinated against meningococcal disease. You may also need additional booster doses.  Varicella immunization.** / Consult your  caregiver.  Hepatitis A immunization.** / Consult your caregiver. 2 doses, 6 to 18 months apart.  Hepatitis B immunization.** / Consult your caregiver. 3 doses usually over 6 months. Ages 69 to 61  Blood pressure check.** / Every 1 to 2 years.  Lipid and cholesterol check.** / Every 5 years beginning at age 61.  Fecal occult blood test (FOBT) of stool. / Every year beginning at age 59 and continuing until age 24. You may not have to do this test if you get colonoscopy every 10 years.  Flexible sigmoidoscopy** or colonoscopy.** / Every 5 years  for a flexible sigmoidoscopy or every 10 years for a colonoscopy beginning at age 2 and continuing until age 67.  Hepatitis C blood test.** / For all people born from [birth year] through 1964 and any individual with known risks for hepatitis C.  Skin self-exam. / Monthly.  Influenza immunization.** / Every year.  Pneumococcal polysaccharide immunization.** / 1 to 2 doses if you smoke cigarettes or if you have certain chronic medical conditions.  Tetanus, diphtheria, pertussis (Tdap/Td) immunization.** / A one-time dose of Tdap vaccine. After that, you need a Td booster dose every 10 years.  Measles, mumps, rubella (MMR) immunization. / You need at least 1 dose of MMR if you were born in 1957 or later. You may also need a 2nd dose.  Varicella immunization.**/ Consult your caregiver.  Meningococcal immunization.** / Consult your caregiver.  Hepatitis A immunization.** / Consult your caregiver. 2 doses, 6 to 18 months apart.  Hepatitis B immunization.** / Consult your caregiver. 3 doses, usually over 6 months. Ages 59 and over  Blood pressure check.** / Every 1 to 2 years.  Lipid and cholesterol check.**/ Every 5 years beginning at age 27.  Fecal occult blood test (FOBT) of stool. / Every year beginning at age 66 and continuing until age 61. You may not have to do this test if you get colonoscopy every 10 years.  Flexible sigmoidoscopy** or  colonoscopy.** / Every 5 years for a flexible sigmoidoscopy or every 10 years for a colonoscopy beginning at age 32 and continuing until age 59.  Hepatitis C blood test.** / For all people born from [birth year] through 1964 and any individual with known risks for hepatitis C.  Abdominal aortic aneurysm (AAA) screening.** / A one-time screening for ages 74 to 46 years who are current or former smokers.  Skin self-exam. / Monthly.  Influenza immunization.** / Every year.  Pneumococcal polysaccharide immunization.** / 1 dose at age 29 (or older) if you have never been vaccinated.  Tetanus, diphtheria, pertussis (Tdap, Td) immunization. / A one-time dose of Tdap vaccine if you are over 65 and have contact with an infant, are a Research scientist (physical sciences), or simply want to be protected from whooping cough. After that, you need a Td booster dose every 10 years.  Varicella immunization. ** / Consult your caregiver.  Meningococcal immunization.** / Consult your caregiver.  Hepatitis A immunization. ** / Consult your caregiver. 2 doses, 6 to 18 months apart.  Hepatitis B immunization.** / Check with your caregiver. 3 doses, usually over 6 months. **Family history and personal history of risk and conditions may change your caregiver's recommendations. Document Released: 08/01/2001 Document Revised: 08/28/2011 Document Reviewed: 10/31/2010 [facility] Patient Information 2014 [place], [place].

## 2013-05-09 ENCOUNTER — Other Ambulatory Visit: Payer: 59

## 2013-05-14 ENCOUNTER — Other Ambulatory Visit (INDEPENDENT_AMBULATORY_CARE_PROVIDER_SITE_OTHER): Payer: 59

## 2013-05-14 DIAGNOSIS — R7989 Other specified abnormal findings of blood chemistry: Secondary | ICD-10-CM

## 2013-05-14 LAB — HEPATIC FUNCTION PANEL
ALT: 28 U/L (ref 0–53)
Albumin: 4.2 g/dL (ref 3.5–5.2)
Alkaline Phosphatase: 68 U/L (ref 39–117)
Total Protein: 7.4 g/dL (ref 6.0–8.3)

## 2013-05-23 ENCOUNTER — Encounter: Payer: Self-pay | Admitting: Family Medicine

## 2013-11-05 ENCOUNTER — Encounter: Payer: Self-pay | Admitting: Internal Medicine

## 2013-11-05 ENCOUNTER — Ambulatory Visit (INDEPENDENT_AMBULATORY_CARE_PROVIDER_SITE_OTHER): Payer: 59 | Admitting: Internal Medicine

## 2013-11-05 VITALS — BP 116/76 | HR 81 | Temp 98.7°F | Ht 70.0 in | Wt 196.0 lb

## 2013-11-05 DIAGNOSIS — J069 Acute upper respiratory infection, unspecified: Secondary | ICD-10-CM

## 2013-11-05 DIAGNOSIS — J029 Acute pharyngitis, unspecified: Secondary | ICD-10-CM

## 2013-11-05 MED ORDER — AMOXICILLIN 500 MG PO CAPS: 500.0000 mg | ORAL_CAPSULE | Freq: Three times a day (TID) | ORAL | Status: DC

## 2013-11-05 NOTE — Patient Instructions (Signed)
Treating for bacterial sinus infection   That could be causing the throat redness and some increase in tonsil size . Gargles .  Continue saline nose spray and can add flonase for a week also . Expect improvement in the next 3-5 day . If not contact us for advice

## 2013-11-05 NOTE — Progress Notes (Signed)
Chief Complaint  Patient presents with  . Sore Throat    Ongoing for 2 weeks  . Cough  . Otalgia    HPI: Patient comes in today for SDA for  new problem evaluation. Here with wife Has upper respiratory infection at some point had fever daughter had the same thing but never had fever. However this week is getting worse with severe.   Sore throat in am post nasal drainage    Mild cough ears hurt off an on.  No sinus pain .  Wife says began snoring  And never does  this  tender ears at times  ROS: See pertinent positives and negatives per HPI. No unusual rashes GI is working.  Past Medical History  Diagnosis Date  . Hyperlipidemia   . GERD (gastroesophageal reflux disease)   . Nephrolithiasis 08/2008    left    Family History  Problem Relation Age of Onset  . Cancer Mother     eye  . Colon cancer Neg Hx     History   Social History  . Marital Status: Married    Spouse Name: N/A    Number of Children: N/A  . Years of Education: N/A   Social History Main Topics  . Smoking status: Never Smoker   . Smokeless tobacco: None  . Alcohol Use: Yes     Comment: socially; currently none due to symptoms  . Drug Use: No  . Sexual Activity: None   Other Topics Concern  . None   Social History Narrative   HH of 4   No Pets   7-8 hours of sleep   Airplane Mechanics    Outpatient Encounter Prescriptions as of 11/05/2013  Medication Sig  . amoxicillin (AMOXIL) 500 MG capsule Take 1 capsule (500 mg total) by mouth 3 (three) times daily.  . [DISCONTINUED] clobetasol ointment (TEMOVATE) 0.05 %     EXAM:  BP 116/76  Pulse 81  Temp(Src) 98.7 F (37.1 C) (Oral)  Ht 5\' 10"  (1.778 m)  Wt 196 lb (88.905 kg)  BMI 28.12 kg/m2  SpO2 98%  Body mass index is 28.12 kg/(m^2).  GENERAL: vitals reviewed and listed above, alert, oriented, appears well hydrated and in no acute distress HEENT: atraumatic, conjunctiva  clear, no obvious abnormalities on inspection of external nose  and ears nares congested face not really tender TMs intact with normal landmarks OP :  Tonsils +1 very tiny exudate left no edema cobblestoning noted NECK: no obvious masses on inspection palpation shotty tender a.c. nodes LUNGS: clear to auscultation bilaterally, no wheezes, rales or rhonchi, good air movement CV: HRRR, no clubbing cyanosis or  peripheral edema nl cap refill  MS: moves all extremities without noticeable focal  abnormality PSYCH: pleasant and cooperative, no obvious depression or anxiety  ASSESSMENT AND PLAN:  Discussed the following assessment and plan:  Acute pharyngitis  Protracted URI probable sinusitis Symptoms getting worse after 2 weeks suspect bacterial nasopharyngitis based on exam. I suppose it could be viral mono-like but doubted. Empiric treatment amoxicillin 3 times a day for 10 days continues nasal saline and Flonase for a week expectant management and followup or contact us if not improving. -Patient advised to return or notify health care team  if symptoms worsen ,persist or new concerns arise.  Patient Instructions  Treating for bacterial sinus infection   That could be causing the throat redness and some increase in tonsil size . Gargles .  Continue saline nose spray and can add  flonase for a week also . Expect improvement in the next 3-5 day . If not contact us for advice        Standley Brooking. Bralynn Donado M.D.  Pre visit review using our clinic review tool, if applicable. No additional management support is needed unless otherwise documented below in the visit note.

## 2015-09-15 NOTE — Progress Notes (Signed)
Chief Complaint  Patient presents with  . Gastroesophageal Reflux    HPI: Kenneth [name] 50 y.o.  Comes in today seen  Last seen almost 2 years ago   He is generally well.   Hx of endoscopy   2003  And 2004 had gerd   .   rx with meds  Prevacid   Used otc in [place].    And was gone  Until  about  3 months ago  Began again  ? After spicy jalapeno food .   Waxing and waning and now  For 2 months of steady night but not positional  Laying may be better   Begin s late in day .   Eating helps some most burning   Med sternam  .  Not a lot of waterbrash.   Burp.  ocass nignbth wakening .  Burning wer mid chest  ocass like pressure bulging but no dysphagia or vomitng pos buring  Hard to sleep.  Quit coffee recently .  ocass .   trying pepcid a week and prilosec for a week and  No better .    No weight loss  Some 5 # or so over time  Tends top occur afternoon and evening  ROS: See pertinent positives and negatives per HPI. prob with naisl ? Dx one said psoriasis  One poss fungus  uncertaindx derm  No longer available   Past Medical History  Diagnosis Date  . Hyperlipidemia   . GERD (gastroesophageal reflux disease)   . Nephrolithiasis 08/2008    left    Family History  Problem Relation Age of Onset  . Cancer Mother     eye  . Colon cancer Neg Hx     Social History   Social History  . Marital Status: Married    Spouse Name: N/A  . Number of Children: N/A  . Years of Education: N/A   Social History Main Topics  . Smoking status: Never Smoker   . Smokeless tobacco: None  . Alcohol Use: Yes     Comment: socially; currently none due to symptoms  . Drug Use: No  . Sexual Activity: Not Asked   Other Topics Concern  . 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 amoxicillin (AMOXIL) 500 MG capsule Take 1 capsule (500 mg total) by mouth 3 (three)  times daily. 30 capsule 0   No facility-administered medications prior to visit.     EXAM:  BP 120/80 mmHg  Pulse 77  Temp(Src) 98.7 F (37.1 C)  Wt 202 lb (91.627 kg)  SpO2 98%  Body mass index is 28.98 kg/(m^2).  GENERAL: vitals reviewed and listed above, alert, oriented, appears well hydrated and in no acute distress HEENT: atraumatic, conjunctiva  clear, no obvious abnormalities on inspection of external nose and ears OP : no lesion edema or exudate  NECK: no obvious masses on inspection palpation  LUNGS: clear to auscultation bilaterally, no wheezes, rales or rhonchi, good air movement CV: HRRR, no clubbing cyanosis or  peripheral edema nl cap refill  Abdomen:  Sof,t normal bowel sounds without hepatosplenomegaly, no guarding rebound or masses no CVA tenderness MS: moves all extremities without noticeable focal x Abnormality finger nails  Some dysphydrotic onycholysis change hands   Mild  Hand schanges  No redness  PSYCH: pleasant and cooperative, no obvious depression or anxiety  ASSESSMENT AND PLAN:  Discussed the following assessment and plan:  Gastroesophageal reflux disease, esophagitis presence not specified  Nail deformity - disc seeing derm  dx etc  no hx psoriasis  -Patient advised to return or notify health care team  if symptoms worsen ,persist or new concerns arise.  Patient Instructions  This again is  GERD    Dietary awareness   Small amounts  Meals .  Take longer to eat .   New acid blocker  And rov in a month  If  persistent or progressive we may have you see GI  Dr Fuller Plan.      Food Choices for Gastroesophageal Reflux Disease, Adult When you have gastroesophageal reflux disease (GERD), the foods you eat and your eating habits are very important. Choosing the right foods can help ease the discomfort of GERD. WHAT GENERAL GUIDELINES DO I NEED TO FOLLOW?  Choose fruits, vegetables, whole grains, low-fat dairy products, and low-fat meat, fish, and  poultry.  Limit fats such as oils, salad dressings, butter, nuts, and avocado.  Keep a food diary to identify foods that cause symptoms.  Avoid foods that cause reflux. These may be different for different people.  Eat frequent small meals instead of three large meals each day.  Eat your meals slowly, in a relaxed setting.  Limit fried foods.  Cook foods using methods other than frying.  Avoid drinking alcohol.  Avoid drinking large amounts of liquids with your meals.  Avoid bending over or lying down until 2-3 hours after eating. WHAT FOODS ARE NOT RECOMMENDED? The following are some foods and drinks that may worsen your symptoms: Vegetables Tomatoes. Tomato juice. Tomato and spaghetti sauce. Chili peppers. Onion and garlic. Horseradish. Fruits Oranges, grapefruit, and lemon (fruit and juice). Meats High-fat meats, fish, and poultry. This includes hot dogs, ribs, ham, sausage, salami, and bacon. Dairy Whole milk and chocolate milk. Sour cream. Cream. Butter. Ice cream. Cream cheese.  Beverages Coffee and tea, with or without caffeine. Carbonated beverages or energy drinks. Condiments Hot sauce. Barbecue sauce.  Sweets/Desserts Chocolate and cocoa. Donuts. Peppermint and spearmint. Fats and Oils High-fat foods, including [place] fries and potato chips. Other Vinegar. Strong spices, such as black pepper, white pepper, red pepper, cayenne, curry powder, cloves, ginger, and chili powder. The items listed above may not be a complete list of foods and beverages to avoid. Contact your dietitian for more information.   This information is not intended to replace advice given to you by your health care provider. Make sure you discuss any questions you have with your health care provider.   Document Released: 06/05/2005 Document Revised: 06/26/2014 Document Reviewed: 04/09/2013 Elsevier Interactive Patient Education 2016 [place].  Gastroesophageal Reflux Disease,  Adult Normally, food travels down the esophagus and stays in the stomach to be digested. However, when a person has gastroesophageal reflux disease (GERD), food and stomach acid move back up into the esophagus. When this happens, the esophagus becomes sore and inflamed. Over time, GERD can create small holes (ulcers) in the lining of the esophagus.  CAUSES This condition is caused by a problem with the muscle between the esophagus and the stomach (lower esophageal sphincter, or LES). Normally, the LES muscle closes after food passes through the esophagus to the stomach. When the LES is weakened or abnormal, it does not close properly, and that allows food and stomach acid to go back up into the esophagus. The LES  can be weakened by certain dietary substances, medicines, and medical conditions, including:  Tobacco use.  Pregnancy.  Having a hiatal hernia.  Heavy alcohol use.  Certain foods and beverages, such as coffee, chocolate, onions, and peppermint. RISK FACTORS This condition is more likely to develop in:  People who have an increased body weight.  People who have connective tissue disorders.  People who use NSAID medicines. SYMPTOMS Symptoms of this condition include:  Heartburn.  Difficult or painful swallowing.  The feeling of having a lump in the throat.  Abitter taste in the mouth.  Bad breath.  Having a large amount of saliva.  Having an upset or bloated stomach.  Belching.  Chest pain.  Shortness of breath or wheezing.  Ongoing (chronic) cough or a night-time cough.  Wearing away of tooth enamel.  Weight loss. Different conditions can cause chest pain. Make sure to see your health care provider if you experience chest pain. DIAGNOSIS Your health care provider will take a medical history and perform a physical exam. To determine if you have mild or severe GERD, your health care provider may also monitor how you respond to treatment. You may also have  other tests, including:  An endoscopy toexamine your stomach and esophagus with a small camera.  A test thatmeasures the acidity level in your esophagus.  A test thatmeasures how much pressure is on your esophagus.  A barium swallow or modified barium swallow to show the shape, size, and functioning of your esophagus. TREATMENT The goal of treatment is to help relieve your symptoms and to prevent complications. Treatment for this condition may vary depending on how severe your symptoms are. Your health care provider may recommend:  Changes to your diet.  Medicine.  Surgery. HOME CARE INSTRUCTIONS Diet  Follow a diet as recommended by your health care provider. This may involve avoiding foods and drinks such as:  Coffee and tea (with or without caffeine).  Drinks that containalcohol.  Energy drinks and sports drinks.  Carbonated drinks or sodas.  Chocolate and cocoa.  Peppermint and mint flavorings.  Garlic and onions.  Horseradish.  Spicy and acidic foods, including peppers, chili powder, curry powder, vinegar, hot sauces, and barbecue sauce.  Citrus fruit juices and citrus fruits, such as oranges, lemons, and limes.  Tomato-based foods, such as red sauce, chili, salsa, and pizza with red sauce.  Fried and fatty foods, such as donuts, french fries, potato chips, and high-fat dressings.  High-fat meats, such as hot dogs and fatty cuts of red and white meats, such as rib eye steak, sausage, ham, and bacon.  High-fat dairy items, such as whole milk, butter, and cream cheese.  Eat small, frequent meals instead of large meals.  Avoid drinking large amounts of liquid with your meals.  Avoid eating meals during the 2-3 hours before bedtime.  Avoid lying down right after you eat.  Do not exercise right after you eat. General Instructions  Pay attention to any changes in your symptoms.  Take over-the-counter and prescription medicines only as told by your  health care provider. Do not take aspirin, ibuprofen, or other NSAIDs unless your health care provider told you to do so.  Do not use any tobacco products, including cigarettes, chewing tobacco, and e-cigarettes. If you need help quitting, ask your health care provider.  Wear loose-fitting clothing. Do not wear anything tight around your waist that causes pressure on your abdomen.  Raise (elevate) the head of your bed 6 inches (15cm).  Try  to reduce your stress, such as with yoga or meditation. If you need help reducing stress, ask your health care provider.  If you are overweight, reduce your weight to an amount that is healthy for you. Ask your health care provider for guidance about a safe weight loss goal.  Keep all follow-up visits as told by your health care provider. This is important. SEEK MEDICAL CARE IF:  You have new symptoms.  You have unexplained weight loss.  You have difficulty swallowing, or it hurts to swallow.  You have wheezing or a persistent cough.  Your symptoms do not improve with treatment.  You have a hoarse voice. SEEK IMMEDIATE MEDICAL CARE IF:  You have pain in your arms, neck, jaw, teeth, or back.  You feel sweaty, dizzy, or light-headed.  You have chest pain or shortness of breath.  You vomit and your vomit looks like blood or coffee grounds.  You faint.  Your stool is bloody or black.  You cannot swallow, drink, or eat.   This information is not intended to replace advice given to you by your health care provider. Make sure you discuss any questions you have with your health care provider.   Document Released: 03/15/2005 Document Revised: 02/24/2015 Document Reviewed: 09/30/2014 Elsevier Interactive Patient Education 2016 Naplate K. Panosh M.D.

## 2015-09-16 ENCOUNTER — Ambulatory Visit (INDEPENDENT_AMBULATORY_CARE_PROVIDER_SITE_OTHER): Payer: 59 | Admitting: Internal Medicine

## 2015-09-16 ENCOUNTER — Encounter: Payer: Self-pay | Admitting: Internal Medicine

## 2015-09-16 VITALS — BP 120/80 | HR 77 | Temp 98.7°F | Wt 202.0 lb

## 2015-09-16 DIAGNOSIS — L608 Other nail disorders: Secondary | ICD-10-CM

## 2015-09-16 DIAGNOSIS — L609 Nail disorder, unspecified: Secondary | ICD-10-CM | POA: Diagnosis not present

## 2015-09-16 DIAGNOSIS — K219 Gastro-esophageal reflux disease without esophagitis: Secondary | ICD-10-CM

## 2015-09-16 MED ORDER — PANTOPRAZOLE SODIUM 40 MG PO TBEC: 40.0000 mg | DELAYED_RELEASE_TABLET | Freq: Every day | ORAL | Status: DC

## 2015-09-16 NOTE — Patient Instructions (Addendum)
This again is  GERD    Dietary awareness   Small amounts  Meals .  Take longer to eat .   New acid blocker  And rov in a month  If  persistent or progressive we may have you see GI  Dr Fuller Plan.      Food Choices for Gastroesophageal Reflux Disease, Adult When you have gastroesophageal reflux disease (GERD), the foods you eat and your eating habits are very important. Choosing the right foods can help ease the discomfort of GERD. WHAT GENERAL GUIDELINES DO I NEED TO FOLLOW?  Choose fruits, vegetables, whole grains, low-fat dairy products, and low-fat meat, fish, and poultry.  Limit fats such as oils, salad dressings, butter, nuts, and avocado.  Keep a food diary to identify foods that cause symptoms.  Avoid foods that cause reflux. These may be different for different people.  Eat frequent small meals instead of three large meals each day.  Eat your meals slowly, in a relaxed setting.  Limit fried foods.  Cook foods using methods other than frying.  Avoid drinking alcohol.  Avoid drinking large amounts of liquids with your meals.  Avoid bending over or lying down until 2-3 hours after eating. WHAT FOODS ARE NOT RECOMMENDED? The following are some foods and drinks that may worsen your symptoms: Vegetables Tomatoes. Tomato juice. Tomato and spaghetti sauce. Chili peppers. Onion and garlic. Horseradish. Fruits Oranges, grapefruit, and lemon (fruit and juice). Meats High-fat meats, fish, and poultry. This includes hot dogs, ribs, ham, sausage, salami, and bacon. Dairy Whole milk and chocolate milk. Sour cream. Cream. Butter. Ice cream. Cream cheese.  Beverages Coffee and tea, with or without caffeine. Carbonated beverages or energy drinks. Condiments Hot sauce. Barbecue sauce.  Sweets/Desserts Chocolate and cocoa. Donuts. Peppermint and spearmint. Fats and Oils High-fat foods, including [place] fries and potato chips. Other Vinegar. Strong spices, such as black pepper,  white pepper, red pepper, cayenne, curry powder, cloves, ginger, and chili powder. The items listed above may not be a complete list of foods and beverages to avoid. Contact your dietitian for more information.   This information is not intended to replace advice given to you by your health care provider. Make sure you discuss any questions you have with your health care provider.   Document Released: 06/05/2005 Document Revised: 06/26/2014 Document Reviewed: 04/09/2013 Elsevier Interactive Patient Education 2016 [place].  Gastroesophageal Reflux Disease, Adult Normally, food travels down the esophagus and stays in the stomach to be digested. However, when a person has gastroesophageal reflux disease (GERD), food and stomach acid move back up into the esophagus. When this happens, the esophagus becomes sore and inflamed. Over time, GERD can create small holes (ulcers) in the lining of the esophagus.  CAUSES This condition is caused by a problem with the muscle between the esophagus and the stomach (lower esophageal sphincter, or LES). Normally, the LES muscle closes after food passes through the esophagus to the stomach. When the LES is weakened or abnormal, it does not close properly, and that allows food and stomach acid to go back up into the esophagus. The LES can be weakened by certain dietary substances, medicines, and medical conditions, including:  Tobacco use.  Pregnancy.  Having a hiatal hernia.  Heavy alcohol use.  Certain foods and beverages, such as coffee, chocolate, onions, and peppermint. RISK FACTORS This condition is more likely to develop in:  People who have an increased body weight.  People who have connective tissue disorders.  People who  use NSAID medicines. SYMPTOMS Symptoms of this condition include:  Heartburn.  Difficult or painful swallowing.  The feeling of having a lump in the throat.  Abitter taste in the mouth.  Bad breath.  Having a  large amount of saliva.  Having an upset or bloated stomach.  Belching.  Chest pain.  Shortness of breath or wheezing.  Ongoing (chronic) cough or a night-time cough.  Wearing away of tooth enamel.  Weight loss. Different conditions can cause chest pain. Make sure to see your health care provider if you experience chest pain. DIAGNOSIS Your health care provider will take a medical history and perform a physical exam. To determine if you have mild or severe GERD, your health care provider may also monitor how you respond to treatment. You may also have other tests, including:  An endoscopy toexamine your stomach and esophagus with a small camera.  A test thatmeasures the acidity level in your esophagus.  A test thatmeasures how much pressure is on your esophagus.  A barium swallow or modified barium swallow to show the shape, size, and functioning of your esophagus. TREATMENT The goal of treatment is to help relieve your symptoms and to prevent complications. Treatment for this condition may vary depending on how severe your symptoms are. Your health care provider may recommend:  Changes to your diet.  Medicine.  Surgery. HOME CARE INSTRUCTIONS Diet  Follow a diet as recommended by your health care provider. This may involve avoiding foods and drinks such as:  Coffee and tea (with or without caffeine).  Drinks that containalcohol.  Energy drinks and sports drinks.  Carbonated drinks or sodas.  Chocolate and cocoa.  Peppermint and mint flavorings.  Garlic and onions.  Horseradish.  Spicy and acidic foods, including peppers, chili powder, curry powder, vinegar, hot sauces, and barbecue sauce.  Citrus fruit juices and citrus fruits, such as oranges, lemons, and limes.  Tomato-based foods, such as red sauce, chili, salsa, and pizza with red sauce.  Fried and fatty foods, such as donuts, french fries, potato chips, and high-fat dressings.  High-fat meats,  such as hot dogs and fatty cuts of red and white meats, such as rib eye steak, sausage, ham, and bacon.  High-fat dairy items, such as whole milk, butter, and cream cheese.  Eat small, frequent meals instead of large meals.  Avoid drinking large amounts of liquid with your meals.  Avoid eating meals during the 2-3 hours before bedtime.  Avoid lying down right after you eat.  Do not exercise right after you eat. General Instructions  Pay attention to any changes in your symptoms.  Take over-the-counter and prescription medicines only as told by your health care provider. Do not take aspirin, ibuprofen, or other NSAIDs unless your health care provider told you to do so.  Do not use any tobacco products, including cigarettes, chewing tobacco, and e-cigarettes. If you need help quitting, ask your health care provider.  Wear loose-fitting clothing. Do not wear anything tight around your waist that causes pressure on your abdomen.  Raise (elevate) the head of your bed 6 inches (15cm).  Try to reduce your stress, such as with yoga or meditation. If you need help reducing stress, ask your health care provider.  If you are overweight, reduce your weight to an amount that is healthy for you. Ask your health care provider for guidance about a safe weight loss goal.  Keep all follow-up visits as told by your health care provider. This is important. SEEK  MEDICAL CARE IF:  You have new symptoms.  You have unexplained weight loss.  You have difficulty swallowing, or it hurts to swallow.  You have wheezing or a persistent cough.  Your symptoms do not improve with treatment.  You have a hoarse voice. SEEK IMMEDIATE MEDICAL CARE IF:  You have pain in your arms, neck, jaw, teeth, or back.  You feel sweaty, dizzy, or light-headed.  You have chest pain or shortness of breath.  You vomit and your vomit looks like blood or coffee grounds.  You faint.  Your stool is bloody or  black.  You cannot swallow, drink, or eat.   This information is not intended to replace advice given to you by your health care provider. Make sure you discuss any questions you have with your health care provider.   Document Released: 03/15/2005 Document Revised: 02/24/2015 Document Reviewed: 09/30/2014 Elsevier Interactive Patient Education [employer].

## 2015-10-12 ENCOUNTER — Ambulatory Visit (INDEPENDENT_AMBULATORY_CARE_PROVIDER_SITE_OTHER): Payer: 59 | Admitting: Internal Medicine

## 2015-10-12 ENCOUNTER — Encounter: Payer: Self-pay | Admitting: Internal Medicine

## 2015-10-12 VITALS — BP 118/78 | Temp 98.5°F

## 2015-10-12 DIAGNOSIS — Z79899 Other long term (current) drug therapy: Secondary | ICD-10-CM

## 2015-10-12 DIAGNOSIS — K219 Gastro-esophageal reflux disease without esophagitis: Secondary | ICD-10-CM | POA: Diagnosis not present

## 2015-10-12 NOTE — Patient Instructions (Addendum)
continue same medication  You will be contacted about a GI appt.   Not alarming but more severe sx than   I would  Like to have.  May require  Repeat procedure r opinion    to see what is going on .

## 2015-10-12 NOTE — Progress Notes (Signed)
  Pre visit review using our clinic review tool, if applicable. No additional management support is needed unless otherwise documented below in the visit note.  Chief Complaint  Patient presents with  . Follow-up    HPI:  Kenneth [name] 50 y.o.  comesin fro fu gerd sx and put on ppi for a trial and  lsi     Med helps about 50 % better still has some local pressure lower esophagus area  No vpomitng     No waterbrash    lost weight cause of diet change   No new sx  No vomiting   ROS: See pertinent positives and negatives per HPI. Hx endo 2001  Massive reflux?  Past Medical History  Diagnosis Date  . Hyperlipidemia   . GERD (gastroesophageal reflux disease)   . Nephrolithiasis 08/2008    left    Family History  Problem Relation Age of Onset  . Cancer Mother     eye  . Colon cancer Neg Hx     Social History   Social History  . Marital Status: Married    Spouse Name: N/A  . Number of Children: N/A  . Years of Education: N/A   Social History Main Topics  . Smoking status: Never Smoker   . Smokeless tobacco: Not on file  . Alcohol Use: Yes     Comment: socially; currently none due to symptoms  . Drug Use: No  . Sexual Activity: Not on file   Other Topics Concern  . 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 pantoprazole (PROTONIX) 40 MG tablet Take 1 tablet (40 mg total) by mouth daily. 30 tablet 1   No facility-administered medications prior to visit.     EXAM:  BP 118/78 mmHg  Temp(Src) 98.5 F (36.9 C) (Oral)  There is no weight on file to calculate BMI.  GENERAL: vitals reviewed and listed above, alert, oriented, appears well hydrated and in no acute distress HEENT: atraumatic, conjunctiva  clear, no obvious abnormalities on inspection of external nose and ears MS: moves all extremities without noticeable focal   abnormality PSYCH: pleasant and cooperative, no obvious depression or anxiety  ASSESSMENT AND PLAN:  Discussed the following assessment and plan:  Gastroesophageal reflux disease, esophagitis presence not specified  Medication management ? Need for repeat endo  Or at least opinion about   rx intervention is better but after a month of meds still has 50 %  Sx left  Suspect esophagitis  -Patient advised to return or notify health care team  if symptoms worsen ,persist or new concerns arise.  Patient Instructions  continue same medication  You will be contacted about a GI appt.   Not alarming but more severe sx than   I would  Like to have.  May require  Repeat procedure r opinion    to see what is going on .     Standley Brooking. Dyann Goodspeed M.D.  Wt Readings from Last 3 Encounters:  09/16/15 202 lb (91.627 kg)  11/05/13 196 lb (88.905 kg)  03/18/13 192 lb (87.091 kg)

## 2015-10-14 ENCOUNTER — Ambulatory Visit (INDEPENDENT_AMBULATORY_CARE_PROVIDER_SITE_OTHER): Payer: 59 | Admitting: Gastroenterology

## 2015-10-14 ENCOUNTER — Encounter: Payer: Self-pay | Admitting: Gastroenterology

## 2015-10-14 VITALS — BP 110/60 | HR 72 | Ht 71.0 in | Wt 194.0 lb

## 2015-10-14 DIAGNOSIS — Z1211 Encounter for screening for malignant neoplasm of colon: Secondary | ICD-10-CM | POA: Diagnosis not present

## 2015-10-14 DIAGNOSIS — K219 Gastro-esophageal reflux disease without esophagitis: Secondary | ICD-10-CM

## 2015-10-14 MED ORDER — PANTOPRAZOLE SODIUM 40 MG PO TBEC: 40.0000 mg | DELAYED_RELEASE_TABLET | Freq: Two times a day (BID) | ORAL | Status: DC

## 2015-10-14 NOTE — Patient Instructions (Signed)
Increase your pantoprazole 40 mg twice daily. A new prescription has been sent to your pharmacy.   Patient advised to avoid spicy, acidic, citrus, chocolate, mints, fruit and fruit juices.  Limit the intake of caffeine, alcohol and Soda.  Don't exercise too soon after eating.  Don't lie down within 3-4 hours of eating.  Elevate the head of your bed.  Please call back in 4-6 weeks if see no improvement in your symptoms.   Thank you for choosing me and [facility].  Pricilla Riffle. Dagoberto Ligas., MD., Marval Regal

## 2015-10-14 NOTE — Progress Notes (Signed)
    History of Present Illness: This is a 50 year old male referred by Panosh, Standley Brooking, MD for the evaluation of GERD. He has a history of GERD and underwent UGI series and EGD in 2004 both were unremarkable except for UGI series showing reflux. He had been off medications for several years and had a return of lower sternal burning and chest pressure. Symptoms have improved with Protonix but have not resolved. Denies weight loss, abdominal pain, constipation, diarrhea, change in stool caliber, melena, hematochezia, nausea, vomiting, dysphagia.  Review of Systems: Pertinent positive and negative review of systems were noted in the above HPI section. All other review of systems were otherwise negative.  Current Medications, Allergies, Past Medical History, Past Surgical History, Family History and Social History were reviewed in [employer] record.  Physical Exam: General: Well developed, well nourished, no acute distress Head: Normocephalic and atraumatic Eyes:  sclerae anicteric, EOMI Ears: Normal auditory acuity Mouth: No deformity or lesions Neck: Supple, no masses or thyromegaly Lungs: Clear throughout to auscultation Heart: Regular rate and rhythm; no murmurs, rubs or bruits Abdomen: Soft, non tender and non distended. No masses, hepatosplenomegaly or hernias noted. Normal Bowel sounds Musculoskeletal: Symmetrical with no gross deformities  Skin: No lesions on visible extremities Pulses:  Normal pulses noted Extremities: No clubbing, cyanosis, edema or deformities noted Neurological: Alert oriented x 4, grossly nonfocal Cervical Nodes:  No significant cervical adenopathy Inguinal Nodes: No significant inguinal adenopathy Psychological:  Alert and cooperative. Normal mood and affect  Assessment and Recommendations:  1. GERD. Follow antireflux measures. Increase Protonix to 40 mg po bid ac for 4-6 weeks. REV in 4-6 weeks. If symptoms not fully resolved consider  EGD.   2. CRC screening, average risk. Colonoscopy at age 64.   cc: Burnis Medin, MD [address] [place], [place] [address]

## 2015-11-01 ENCOUNTER — Other Ambulatory Visit: Payer: Self-pay | Admitting: Internal Medicine

## 2015-11-02 NOTE — Telephone Encounter (Signed)
Denied.  THIS WAS FILLED BY DR. Fuller Plan IN [place] 2017

## 2016-01-25 ENCOUNTER — Other Ambulatory Visit (INDEPENDENT_AMBULATORY_CARE_PROVIDER_SITE_OTHER): Payer: 59

## 2016-01-25 DIAGNOSIS — Z0000 Encounter for general adult medical examination without abnormal findings: Secondary | ICD-10-CM

## 2016-01-25 LAB — HEPATIC FUNCTION PANEL
ALBUMIN: 4.5 g/dL (ref 3.5–5.2)
ALK PHOS: 82 U/L (ref 39–117)
ALT: 31 U/L (ref 0–53)
AST: 19 U/L (ref 0–37)
Bilirubin, Direct: 0.2 mg/dL (ref 0.0–0.3)
TOTAL PROTEIN: 7.1 g/dL (ref 6.0–8.3)
Total Bilirubin: 0.9 mg/dL (ref 0.2–1.2)

## 2016-01-25 LAB — BASIC METABOLIC PANEL
BUN: 23 mg/dL (ref 6–23)
CALCIUM: 9.7 mg/dL (ref 8.4–10.5)
CHLORIDE: 105 meq/L (ref 96–112)
CO2: 27 meq/L (ref 19–32)
Creatinine, Ser: 1.03 mg/dL (ref 0.40–1.50)
GFR: 81.58 mL/min (ref >60.00)
GLUCOSE: 99 mg/dL (ref 70–99)
Potassium: 4.1 mEq/L (ref 3.5–5.1)
SODIUM: 140 meq/L (ref 135–145)

## 2016-01-25 LAB — CBC WITH DIFFERENTIAL/PLATELET
BASOS ABS: 0 10*3/uL (ref 0.0–0.1)
BASOS PCT: 0.4 % (ref 0.0–3.0)
EOS PCT: 1.6 % (ref 0.0–5.0)
Eosinophils Absolute: 0.1 10*3/uL (ref 0.0–0.7)
HEMATOCRIT: 47.2 % (ref 39.0–52.0)
Hemoglobin: 16.5 g/dL (ref 13.0–17.0)
LYMPHS ABS: 1.6 10*3/uL (ref 0.7–4.0)
LYMPHS PCT: 28.7 % (ref 12.0–46.0)
MCHC: 34.8 g/dL (ref 30.0–36.0)
MCV: 90.6 fl (ref 78.0–100.0)
Monocytes Absolute: 0.5 10*3/uL (ref 0.1–1.0)
Monocytes Relative: 8.4 % (ref 3.0–12.0)
NEUTROS ABS: 3.5 10*3/uL (ref 1.4–7.7)
NEUTROS PCT: 60.9 % (ref 43.0–77.0)
Platelets: 212 10*3/uL (ref 150.0–400.0)
RBC: 5.22 Mil/uL (ref 4.22–5.81)
RDW: 13.5 % (ref 11.5–15.5)
WBC: 5.7 10*3/uL (ref 4.0–10.5)

## 2016-01-25 LAB — LIPID PANEL
Cholesterol: 163 mg/dL (ref 0–200)
HDL: 37.6 mg/dL — AB (ref >39.00)
LDL Cholesterol: 107 mg/dL — ABNORMAL HIGH (ref 0–99)
NONHDL: 125.22
TRIGLYCERIDES: 90 mg/dL (ref 0.0–149.0)
Total CHOL/HDL Ratio: 4
VLDL: 18 mg/dL (ref 0.0–40.0)

## 2016-01-25 LAB — TSH: TSH: 2.18 u[IU]/mL (ref 0.35–4.50)

## 2016-01-31 NOTE — Progress Notes (Signed)
Pre visit review using our clinic review tool, if applicable. No additional management support is needed unless otherwise documented below in the visit note.  Chief Complaint  Patient presents with  . Annual Exam    HPI: Patient  Kenneth [name]  50 y.o. comes in today for Preventive Health Care visit  Has form for work  + Placed on bid ppi in April per dr Fuller Plan and better now ocass hg no dysphagia  Taking no med now    neg fam hx gi disease for colon cancer  Health Maintenance  Topic Date Due  . INFLUENZA VACCINE  01/18/2016  . HIV Screening  01/31/2017 (Originally 01/23/1981)  . TETANUS/TDAP  06/10/2017   Health Maintenance Review LIFESTYLE:  Exercise:  At work  Tobacco/ETS:n Alcohol: per day  Sugar beverages:1 soda MD per day Sleep:7-8 hours  Drug use: no    ROS: still gets ringinin ears  Ear plugs no testing at work  GEN/ HEENT: No fever, significant weight changes sweats headaches vision problems hearing changes, CV/ PULM; No chest pain shortness of breath cough, syncope,edema  change in exercise tolerance. GI /GU: No adominal pain, vomiting, change in bowel habits. No blood in the stool. No significant GU symptoms. SKIN/HEME: ,no acute skin rashes suspicious lesions or bleeding. No lymphadenopathy, nodules, masses.  NEURO/ PSYCH:  No neurologic signs such as weakness numbness. No depression anxiety. IMM/ Allergy: No unusual infections.  Allergy .   REST of 12 system review negative except as per HPI   Past Medical History:  Diagnosis Date  . GERD (gastroesophageal reflux disease)   . Hyperlipidemia   . Nephrolithiasis 08/2008   left    Past Surgical History:  Procedure Laterality Date  . CHOLECYSTECTOMY, LAPAROSCOPIC     2012    Family History  Problem Relation Age of Onset  . Cancer Mother     eye  . Colon cancer Neg Hx     Social History   Social History  . Marital status: Married    Spouse name: N/A  . Number of children: N/A  . Years of  education: N/A   Social History Main Topics  . Smoking status: Never Smoker  . Smokeless tobacco: Never Used  . Alcohol use Yes     Comment: socially; currently none due to symptoms  . Drug use: No  . Sexual activity: Not Asked   Other Topics Concern  . 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 pantoprazole (PROTONIX) 40 MG tablet Take 1 tablet (40 mg total) by mouth 2 (two) times daily. 60 tablet 5   No facility-administered medications prior to visit.      EXAM:  BP 120/76 (BP Location: Right Arm, Patient Position: Sitting, Cuff Size: Large)   Temp 98.6 F (37 C) (Oral)   Ht 5' 10.5" (1.791 m)   Wt 193 lb 12.8 oz (87.9 kg)   BMI 27.41 kg/m   Body mass index is 27.41 kg/m.  Physical Exam: Vital signs reviewed [id number] is a well-developed well-nourished alert cooperative    who appearsr stated age in no acute distress.  HEENT: normocephalic atraumatic , Eyes: PERRL EOM's full, conjunctiva clear, Nares: paten,t no deformity discharge or tenderness., Ears: no deformity EAC's clear TMs with normal landmarks. Mouth: clear OP, no lesions, edema.  Moist mucous  membranes. Dentition in adequate repair. NECK: supple without masses, thyromegaly or bruits. CHEST/PULM:  Clear to auscultation and percussion breath sounds equal no wheeze , rales or rhonchi. No chest wall deformities or tenderness. CV: PMI is nondisplaced, S1 S2 no gallops, murmurs, rubs. Peripheral pulses are full without delay.No JVD .  ABDOMEN: Bowel sounds normal nontender  No guard or rebound, no hepato splenomegal no CVA tenderness.  No hernia. Extremtities:  No clubbing cyanosis or edema, no acute joint swelling or redness no focal atrophy NEURO:  Oriented x3, cranial nerves 3-12 appear to be intact, no obvious focal weakness,gait within normal limits no abnormal reflexes or  asymmetrical SKIN: No acute rashes normal turgor, color, no bruising or petechiae. Fair skin freclkng  PSYCH: Oriented, good eye contact, no obvious depression anxiety, cognition and judgment appear normal. LN: no cervical axillary inguinal adenopathy  Lab Results  Component Value Date   WBC 5.7 01/25/2016   HGB 16.5 01/25/2016   HCT 47.2 01/25/2016   PLT 212.0 01/25/2016   GLUCOSE 99 01/25/2016   CHOL 163 01/25/2016   TRIG 90.0 01/25/2016   HDL 37.60 (L) 01/25/2016   LDLCALC 107 (H) 01/25/2016   ALT 31 01/25/2016   AST 19 01/25/2016   NA 140 01/25/2016   K 4.1 01/25/2016   CL 105 01/25/2016   CREATININE 1.03 01/25/2016   BUN 23 01/25/2016   CO2 27 01/25/2016   TSH 2.18 01/25/2016    ASSESSMENT AND PLAN:  Discussed the following assessment and plan:  Visit for preventive health examination  Gastroesophageal reflux disease, esophagitis presence not specified Form signed   Expectant management. Can try  h2 blocker for hb that remains  Patient Care Team: Burnis Medin, MD as PCP - General Ladene Artist, MD (Gastroenterology) Izora Ribas (Dermatology) Patient Instructions  Continue lifestyle intervention healthy eating and exercise . Try ranitidine  Or pepcid for the residual sx of reflux    .   Due for colon cancer screening next year  Use ear protection at work as discussed .   cpx with labs in a year . Health Maintenance, Male A healthy lifestyle and preventative care can promote health and wellness.  Maintain regular health, dental, and eye exams.  Eat a healthy diet. Foods like vegetables, fruits, whole grains, low-fat dairy products, and lean protein foods contain the nutrients you need and are low in calories. Decrease your intake of foods high in solid fats, added sugars, and salt. Get information about a proper diet from your health care provider, if necessary.  Regular physical exercise is one of the most important things you can do for your health.  Most adults should get at least 150 minutes of moderate-intensity exercise (any activity that increases your heart rate and causes you to sweat) each week. In addition, most adults need muscle-strengthening exercises on 2 or more days a week.   Maintain a healthy weight. The body mass index (BMI) is a screening tool to identify possible weight problems. It provides an estimate of body fat based on height and weight. Your health care provider can find your BMI and can help you achieve or maintain a healthy weight. For males 20 years and older:  A BMI below 18.5 is considered underweight.  A BMI of 18.5 to 24.9 is normal.  A BMI of 25 to 29.9 is considered overweight.  A BMI of 30 and above is considered obese.  Maintain normal blood lipids and cholesterol by exercising and minimizing your intake  of saturated fat. Eat a balanced diet with plenty of fruits and vegetables. Blood tests for lipids and cholesterol should begin at age 57 and be repeated every 5 years. If your lipid or cholesterol levels are high, you are over age 70, or you are at high risk for heart disease, you may need your cholesterol levels checked more frequently.Ongoing high lipid and cholesterol levels should be treated with medicines if diet and exercise are not working.  If you smoke, find out from your health care provider how to quit. If you do not use tobacco, do not start.  Lung cancer screening is recommended for adults aged 60-80 years who are at high risk for developing lung cancer because of a history of smoking. A yearly low-dose CT scan of the lungs is recommended for people who have at least a 30-pack-year history of smoking and are current smokers or have quit within the past 15 years. A pack year of smoking is smoking an average of 1 pack of cigarettes a day for 1 year (for example, a 30-pack-year history of smoking could mean smoking 1 pack a day for 30 years or 2 packs a day for 15 years). Yearly screening should  continue until the smoker has stopped smoking for at least 15 years. Yearly screening should be stopped for people who develop a health problem that would prevent them from having lung cancer treatment.  If you choose to drink alcohol, do not have more than 2 drinks per day. One drink is considered to be 12 oz (360 mL) of beer, 5 oz (150 mL) of wine, or 1.5 oz (45 mL) of liquor.  Avoid the use of street drugs. Do not share needles with anyone. Ask for help if you need support or instructions about stopping the use of drugs.  High blood pressure causes heart disease and increases the risk of stroke. High blood pressure is more likely to develop in:  People who have blood pressure in the end of the normal range (100-139/85-89 mm Hg).  People who are overweight or obese.  People who are African American.  If you are 24-51 years of age, have your blood pressure checked every 3-5 years. If you are 92 years of age or older, have your blood pressure checked every year. You should have your blood pressure measured twice--once when you are at a hospital or clinic, and once when you are not at a hospital or clinic. Record the average of the two measurements. To check your blood pressure when you are not at a hospital or clinic, you can use:  An automated blood pressure machine at a pharmacy.  A home blood pressure monitor.  If you are 44-27 years old, ask your health care provider if you should take aspirin to prevent heart disease.  Diabetes screening involves taking a blood sample to check your fasting blood sugar level. This should be done once every 3 years after age 48 if you are at a normal weight and without risk factors for diabetes. Testing should be considered at a younger age or be carried out more frequently if you are overweight and have at least 1 risk factor for diabetes.  Colorectal cancer can be detected and often prevented. Most routine colorectal cancer screening begins at the age of  47 and continues through age 79. However, your health care provider may recommend screening at an earlier age if you have risk factors for colon cancer. On a yearly basis, your health care provider  may provide home test kits to check for hidden blood in the stool. A small camera at the end of a tube may be used to directly examine the colon (sigmoidoscopy or colonoscopy) to detect the earliest forms of colorectal cancer. Talk to your health care provider about this at age 34 when routine screening begins. A direct exam of the colon should be repeated every 5-10 years through age 57, unless early forms of precancerous polyps or small growths are found.  People who are at an increased risk for hepatitis B should be screened for this virus. You are considered at high risk for hepatitis B if:  You were born in a country where hepatitis B occurs often. Talk with your health care provider about which countries are considered high risk.  Your parents were born in a high-risk country and you have not received a shot to protect against hepatitis B (hepatitis B vaccine).  You have HIV or AIDS.  You use needles to inject street drugs.  You live with, or have sex with, someone who has hepatitis B.  You are a man who has sex with other men (MSM).  You get hemodialysis treatment.  You take certain medicines for conditions like cancer, organ transplantation, and autoimmune conditions.  Hepatitis C blood testing is recommended for all people born from [birth year] through 1964 and any individual with known risk factors for hepatitis C.  Healthy men should no longer receive prostate-specific antigen (PSA) blood tests as part of routine cancer screening. Talk to your health care provider about prostate cancer screening.  Testicular cancer screening is not recommended for adolescents or adult males who have no symptoms. Screening includes self-exam, a health care provider exam, and other screening tests. Consult with  your health care provider about any symptoms you have or any concerns you have about testicular cancer.  Practice safe sex. Use condoms and avoid high-risk sexual practices to reduce the spread of sexually transmitted infections (STIs).  You should be screened for STIs, including gonorrhea and chlamydia if:  You are sexually active and are younger than 24 years.  You are older than 24 years, and your health care provider tells you that you are at risk for this type of infection.  Your sexual activity has changed since you were last screened, and you are at an increased risk for chlamydia or gonorrhea. Ask your health care provider if you are at risk.  If you are at risk of being infected with HIV, it is recommended that you take a prescription medicine daily to prevent HIV infection. This is called pre-exposure prophylaxis (PrEP). You are considered at risk if:  You are a man who has sex with other men (MSM).  You are a heterosexual man who is sexually active with multiple partners.  You take drugs by injection.  You are sexually active with a partner who has HIV.  Talk with your health care provider about whether you are at high risk of being infected with HIV. If you choose to begin PrEP, you should first be tested for HIV. You should then be tested every 3 months for as long as you are taking PrEP.  Use sunscreen. Apply sunscreen liberally and repeatedly throughout the day. You should seek shade when your shadow is shorter than you. Protect yourself by wearing long sleeves, pants, a wide-brimmed hat, and sunglasses year round whenever you are outdoors.  Tell your health care provider of new moles or changes in moles, especially if there is  a change in shape or color. Also, tell your health care provider if a mole is larger than the size of a pencil eraser.  A one-time screening for abdominal aortic aneurysm (AAA) and surgical repair of large AAAs by ultrasound is recommended for men  aged 53-75 years who are current or former smokers.  Stay current with your vaccines (immunizations).   This information is not intended to replace advice given to you by your health care provider. Make sure you discuss any questions you have with your health care provider.   Document Released: 12/02/2007 Document Revised: 06/26/2014 Document Reviewed: 10/31/2010 Elsevier Interactive Patient Education 2016 [place] K. Rey Dansby M.D.

## 2016-02-01 ENCOUNTER — Ambulatory Visit (INDEPENDENT_AMBULATORY_CARE_PROVIDER_SITE_OTHER): Payer: 59 | Admitting: Internal Medicine

## 2016-02-01 ENCOUNTER — Encounter: Payer: Self-pay | Admitting: Internal Medicine

## 2016-02-01 VITALS — BP 120/76 | Temp 98.6°F | Ht 70.5 in | Wt 193.8 lb

## 2016-02-01 DIAGNOSIS — K219 Gastro-esophageal reflux disease without esophagitis: Secondary | ICD-10-CM | POA: Diagnosis not present

## 2016-02-01 DIAGNOSIS — Z0000 Encounter for general adult medical examination without abnormal findings: Secondary | ICD-10-CM | POA: Diagnosis not present

## 2016-02-01 NOTE — Assessment & Plan Note (Signed)
Low hdl .  Attend to lsi

## 2016-02-01 NOTE — Patient Instructions (Signed)
Continue lifestyle intervention healthy eating and exercise . Try ranitidine  Or pepcid for the residual sx of reflux    .   Due for colon cancer screening next year  Use ear protection at work as discussed .   cpx with labs in a year . Health Maintenance, Male A healthy lifestyle and preventative care can promote health and wellness.  Maintain regular health, dental, and eye exams.  Eat a healthy diet. Foods like vegetables, fruits, whole grains, low-fat dairy products, and lean protein foods contain the nutrients you need and are low in calories. Decrease your intake of foods high in solid fats, added sugars, and salt. Get information about a proper diet from your health care provider, if necessary.  Regular physical exercise is one of the most important things you can do for your health. Most adults should get at least 150 minutes of moderate-intensity exercise (any activity that increases your heart rate and causes you to sweat) each week. In addition, most adults need muscle-strengthening exercises on 2 or more days a week.   Maintain a healthy weight. The body mass index (BMI) is a screening tool to identify possible weight problems. It provides an estimate of body fat based on height and weight. Your health care provider can find your BMI and can help you achieve or maintain a healthy weight. For males 20 years and older:  A BMI below 18.5 is considered underweight.  A BMI of 18.5 to 24.9 is normal.  A BMI of 25 to 29.9 is considered overweight.  A BMI of 30 and above is considered obese.  Maintain normal blood lipids and cholesterol by exercising and minimizing your intake of saturated fat. Eat a balanced diet with plenty of fruits and vegetables. Blood tests for lipids and cholesterol should begin at age 5 and be repeated every 5 years. If your lipid or cholesterol levels are high, you are over age 40, or you are at high risk for heart disease, you may need your cholesterol  levels checked more frequently.Ongoing high lipid and cholesterol levels should be treated with medicines if diet and exercise are not working.  If you smoke, find out from your health care provider how to quit. If you do not use tobacco, do not start.  Lung cancer screening is recommended for adults aged 62-80 years who are at high risk for developing lung cancer because of a history of smoking. A yearly low-dose CT scan of the lungs is recommended for people who have at least a 30-pack-year history of smoking and are current smokers or have quit within the past 15 years. A pack year of smoking is smoking an average of 1 pack of cigarettes a day for 1 year (for example, a 30-pack-year history of smoking could mean smoking 1 pack a day for 30 years or 2 packs a day for 15 years). Yearly screening should continue until the smoker has stopped smoking for at least 15 years. Yearly screening should be stopped for people who develop a health problem that would prevent them from having lung cancer treatment.  If you choose to drink alcohol, do not have more than 2 drinks per day. One drink is considered to be 12 oz (360 mL) of beer, 5 oz (150 mL) of wine, or 1.5 oz (45 mL) of liquor.  Avoid the use of street drugs. Do not share needles with anyone. Ask for help if you need support or instructions about stopping the use of drugs.  High  blood pressure causes heart disease and increases the risk of stroke. High blood pressure is more likely to develop in:  People who have blood pressure in the end of the normal range (100-139/85-89 mm Hg).  People who are overweight or obese.  People who are African American.  If you are 36-1 years of age, have your blood pressure checked every 3-5 years. If you are 20 years of age or older, have your blood pressure checked every year. You should have your blood pressure measured twice--once when you are at a hospital or clinic, and once when you are not at a hospital or  clinic. Record the average of the two measurements. To check your blood pressure when you are not at a hospital or clinic, you can use:  An automated blood pressure machine at a pharmacy.  A home blood pressure monitor.  If you are 39-23 years old, ask your health care provider if you should take aspirin to prevent heart disease.  Diabetes screening involves taking a blood sample to check your fasting blood sugar level. This should be done once every 3 years after age 61 if you are at a normal weight and without risk factors for diabetes. Testing should be considered at a younger age or be carried out more frequently if you are overweight and have at least 1 risk factor for diabetes.  Colorectal cancer can be detected and often prevented. Most routine colorectal cancer screening begins at the age of 30 and continues through age 10. However, your health care provider may recommend screening at an earlier age if you have risk factors for colon cancer. On a yearly basis, your health care provider may provide home test kits to check for hidden blood in the stool. A small camera at the end of a tube may be used to directly examine the colon (sigmoidoscopy or colonoscopy) to detect the earliest forms of colorectal cancer. Talk to your health care provider about this at age 47 when routine screening begins. A direct exam of the colon should be repeated every 5-10 years through age 36, unless early forms of precancerous polyps or small growths are found.  People who are at an increased risk for hepatitis B should be screened for this virus. You are considered at high risk for hepatitis B if:  You were born in a country where hepatitis B occurs often. Talk with your health care provider about which countries are considered high risk.  Your parents were born in a high-risk country and you have not received a shot to protect against hepatitis B (hepatitis B vaccine).  You have HIV or AIDS.  You use needles  to inject street drugs.  You live with, or have sex with, someone who has hepatitis B.  You are a man who has sex with other men (MSM).  You get hemodialysis treatment.  You take certain medicines for conditions like cancer, organ transplantation, and autoimmune conditions.  Hepatitis C blood testing is recommended for all people born from [birth year] through 1964 and any individual with known risk factors for hepatitis C.  Healthy men should no longer receive prostate-specific antigen (PSA) blood tests as part of routine cancer screening. Talk to your health care provider about prostate cancer screening.  Testicular cancer screening is not recommended for adolescents or adult males who have no symptoms. Screening includes self-exam, a health care provider exam, and other screening tests. Consult with your health care provider about any symptoms you have or any concerns  you have about testicular cancer.  Practice safe sex. Use condoms and avoid high-risk sexual practices to reduce the spread of sexually transmitted infections (STIs).  You should be screened for STIs, including gonorrhea and chlamydia if:  You are sexually active and are younger than 24 years.  You are older than 24 years, and your health care provider tells you that you are at risk for this type of infection.  Your sexual activity has changed since you were last screened, and you are at an increased risk for chlamydia or gonorrhea. Ask your health care provider if you are at risk.  If you are at risk of being infected with HIV, it is recommended that you take a prescription medicine daily to prevent HIV infection. This is called pre-exposure prophylaxis (PrEP). You are considered at risk if:  You are a man who has sex with other men (MSM).  You are a heterosexual man who is sexually active with multiple partners.  You take drugs by injection.  You are sexually active with a partner who has HIV.  Talk with your health  care provider about whether you are at high risk of being infected with HIV. If you choose to begin PrEP, you should first be tested for HIV. You should then be tested every 3 months for as long as you are taking PrEP.  Use sunscreen. Apply sunscreen liberally and repeatedly throughout the day. You should seek shade when your shadow is shorter than you. Protect yourself by wearing long sleeves, pants, a wide-brimmed hat, and sunglasses year round whenever you are outdoors.  Tell your health care provider of new moles or changes in moles, especially if there is a change in shape or color. Also, tell your health care provider if a mole is larger than the size of a pencil eraser.  A one-time screening for abdominal aortic aneurysm (AAA) and surgical repair of large AAAs by ultrasound is recommended for men aged 59-75 years who are current or former smokers.  Stay current with your vaccines (immunizations).   This information is not intended to replace advice given to you by your health care provider. Make sure you discuss any questions you have with your health care provider.   Document Released: 12/02/2007 Document Revised: 06/26/2014 Document Reviewed: 10/31/2010 Elsevier Interactive Patient Education [employer].

## 2016-07-24 ENCOUNTER — Ambulatory Visit (INDEPENDENT_AMBULATORY_CARE_PROVIDER_SITE_OTHER)
Admission: RE | Admit: 2016-07-24 | Discharge: 2016-07-24 | Disposition: A | Discharge status: Home / Self Care | Payer: 59 | Source: Ambulatory Visit | Attending: Internal Medicine | Admitting: Internal Medicine

## 2016-07-24 ENCOUNTER — Ambulatory Visit (INDEPENDENT_AMBULATORY_CARE_PROVIDER_SITE_OTHER): Payer: 59 | Admitting: Internal Medicine

## 2016-07-24 ENCOUNTER — Encounter: Payer: Self-pay | Admitting: Internal Medicine

## 2016-07-24 ENCOUNTER — Encounter: Payer: Self-pay | Admitting: Family Medicine

## 2016-07-24 VITALS — BP 110/80 | HR 81 | Temp 98.5°F | Wt 198.0 lb

## 2016-07-24 DIAGNOSIS — R6889 Other general symptoms and signs: Secondary | ICD-10-CM

## 2016-07-24 DIAGNOSIS — R509 Fever, unspecified: Secondary | ICD-10-CM

## 2016-07-24 DIAGNOSIS — R05 Cough: Secondary | ICD-10-CM | POA: Diagnosis not present

## 2016-07-24 DIAGNOSIS — J989 Respiratory disorder, unspecified: Secondary | ICD-10-CM

## 2016-07-24 LAB — POCT INFLUENZA A/B
Influenza A, POC: NEGATIVE
Influenza B, POC: NEGATIVE

## 2016-07-24 NOTE — Progress Notes (Signed)
Pre visit review using our clinic review tool, if applicable. No additional management support is needed unless otherwise documented below in the visit note.  Chief Complaint  Patient presents with  . Fever    Started last Tuesday  . Cough  . Generalized Body Aches  . Nasal Congestion  . Fatigue    HPI: Kenneth [name] 51 y.o. sda appt concern about flu like sx  A week and  Onset  fatoigue   Achy and rhinorrhea  And then relapsed again  And thickening   phelgm and cough .  Still have body aches.   Here with wife    ROS: See pertinent positives and negatives per HPI. No cp sob hemoptysis   Achy chills at night  noface pain  Using otc advil mucinexHas some green phlegm no blood.  Past Medical History:  Diagnosis Date  . GERD (gastroesophageal reflux disease)   . Hyperlipidemia   . Nephrolithiasis 08/2008   left    Family History  Problem Relation Age of Onset  . Cancer Mother     eye  . Colon cancer Neg Hx     Social History   Social History  . Marital status: Married    Spouse name: N/A  . Number of children: N/A  . Years of education: N/A   Social History Main Topics  . Smoking status: Never Smoker  . Smokeless tobacco: Never Used  . Alcohol use Yes     Comment: socially; currently none due to symptoms  . Drug use: No  . Sexual activity: Not Asked   Other Topics Concern  . 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   No facility-administered medications prior to visit.      EXAM:  BP 110/80 (BP Location: Right Arm, Patient Position: Sitting, Cuff Size: Large)   Pulse 81   Temp 98.5 F (36.9 C) (Oral)   Wt 198 lb (89.8 kg)   SpO2 97%   BMI 28.01 kg/m   Body mass index is 28.01 kg/m. WDWN in NAD  quiet respirations; mildly congested  somewhat hoarse. Non toxic . HEENT: Normocephalic ;atraumatic , Eyes;  PERRL, EOMs  Full, lids and conjunctiva  clear,,Ears: no deformities, canals nl, TM landmarks normal, Nose: no deformity or discharge but congested;face non  tender Mouth : OP clear without lesion or edema . Neck: Supple without adenopathy or masses or bruits Chest:  Clear to A without wheezes rales or rhonchi CV:  S1-S2 no gallops or murmurs peripheral perfusion is normal Skin :nl perfusion and no acute rashes  poct flu neg( but 7 dayw into ilklness)  ASSESSMENT AND PLAN:  Discussed the following assessment and plan:  Flu-like symptoms - Plan: POC Influenza A/B, DG Chest 2 View  Respiratory illness with fever - Plan: DG Chest 2 View  Symptoms have some pattern of relapsing and low-grade fever but not documented lasting 7-8 days. Get chest x-ray today rule out occult pneumonia if negative continue symptomatic treatment expectant management improved by the end of the week and if not contact us for re-eval. -Patient advised to return or notify health care team  if symptoms worsen ,persist or new concerns arise. Chest x-ray is clear and dramatic treatment and observation. Patient Instructions  I think the original illness was probably flu.  Check chest x ray to day  With concern about possible early pneumonia  Or not    Either way should get better by end of week   Although coughing continue.       Standley Brooking. Fizza Scales M.D.

## 2016-07-24 NOTE — Patient Instructions (Signed)
I think the original illness was probably flu.  Check chest x ray to day  With concern about possible early pneumonia  Or not    Either way should get better by end of week   Although coughing continue.

## 2016-08-17 ENCOUNTER — Encounter: Payer: Self-pay | Admitting: Internal Medicine

## 2016-08-18 NOTE — Telephone Encounter (Signed)
I would make appt for next week     If better can canel the appt  Sent above message

## 2016-09-04 ENCOUNTER — Ambulatory Visit (INDEPENDENT_AMBULATORY_CARE_PROVIDER_SITE_OTHER): Payer: 59 | Admitting: Internal Medicine

## 2016-09-04 ENCOUNTER — Encounter: Payer: Self-pay | Admitting: Internal Medicine

## 2016-09-04 VITALS — BP 112/78 | HR 85 | Temp 98.6°F | Ht 70.5 in | Wt 206.6 lb

## 2016-09-04 DIAGNOSIS — K219 Gastro-esophageal reflux disease without esophagitis: Secondary | ICD-10-CM | POA: Diagnosis not present

## 2016-09-04 DIAGNOSIS — R053 Chronic cough: Secondary | ICD-10-CM

## 2016-09-04 DIAGNOSIS — R05 Cough: Secondary | ICD-10-CM

## 2016-09-04 MED ORDER — PREDNISONE 20 MG PO TABS: ORAL_TABLET | ORAL | 0 refills | Status: DC

## 2016-09-04 MED ORDER — RANITIDINE HCL 150 MG PO TABS: 150.0000 mg | ORAL_TABLET | Freq: Two times a day (BID) | ORAL | 1 refills | Status: DC

## 2016-09-04 NOTE — Patient Instructions (Addendum)
This acts like a post infection   Airway/ lung irritability   Condition   Thus the infection is gone but   Still hyper sensitive airway  desnt seem like  Bacterial infection cause  Exam is good no fever and no  Infected looking  Mucous.   This condition  Can be aggravated by reflux also   Sugar free candy oistead of cough drops. 5 days of prednisone   Add  Back ranitidine  150 mg 1 twice a day for  3-4 weeks and if better can go off.  Fu if cough not resolving in the next  3 weeks  Or fever  Relapsing sx .

## 2016-09-04 NOTE — Progress Notes (Signed)
Chief Complaint  Patient presents with  . Cough    mucinex OTC     HPI: Kenneth [name] 51 y.o.  sda for ongoing cough   Flu like illness  Early February  Neg c xray  Got better after a week or so  But  Had a continuing  Worse at night  scrathcy  Cough   At night gets repetitive. And dry.  No fever   No sob or wheezing   No throat clearing.  No sob  Family member had cough and received and antibiotic    Sometime  pnd  Not new.    ROS: See pertinent positives and negatives per HPI. ocass HB 2-3 x per week no t on meds because nothgin seems to work  No more of the mid line severe cp  As in past when saw   GI  Dr Fuller Plan   Works around [employer] and pain spray  But nodirectly inhaled  No hx of lung diseas and no sob    Past Medical History:  Diagnosis Date  . GERD (gastroesophageal reflux disease)   . Hyperlipidemia   . Nephrolithiasis 08/2008   left    Family History  Problem Relation Age of Onset  . Cancer Mother     eye  . Colon cancer Neg Hx     Social History   Social History  . Marital status: Married    Spouse name: N/A  . Number of children: N/A  . Years of education: N/A   Social History Main Topics  . Smoking status: Never Smoker  . Smokeless tobacco: Never Used  . Alcohol use Yes     Comment: socially; currently none due to symptoms  . Drug use: No  . Sexual activity: Not on file   Other Topics Concern  . 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   No facility-administered medications prior to visit.      EXAM:  BP 112/78 (BP Location: Right Arm, Patient Position: Sitting, Cuff Size: Normal)   Pulse 85   Temp 98.6 F (37 C) (Oral)   Ht 5' 10.5" (1.791 m)   Wt 206 lb 9.6 oz (93.7 kg)   SpO2 98%   BMI 29.23 kg/m   Body mass index is 29.23 kg/m.  GENERAL: vitals reviewed and listed above, alert, oriented, appears well hydrated and  in no acute distress HEENT: atraumatic, conjunctiva  clear, no obvious abnormalities on inspection of external nose and ears OP : no lesion edema or exudate  NECK: no obvious masses on inspection palpation  LUNGS: clear to auscultation bilaterally, no wheezes, rales or rhonchi, good air movement CV: HRRR, no clubbing cyanosis or  peripheral edema nl cap refill  MS: moves all extremities without noticeable focal  abnormality PSYCH: pleasant and cooperative, no obvious depression or anxiety  ASSESSMENT AND PLAN:  Discussed the following assessment and plan:  Cough, persistent - poss post infectious cough no obv bacterial infection ? if occ espoure adding etc  exam reassuing  Gastroesophageal reflux disease, esophagitis presence not specified Poss post infectious irritable airway plus poss acid trigger?  No evidence of bacterial infection such as sinusitis at this time  Although does have chronic post nasal drip contact us   In a few weeks about progress  Consider wearing mask at  work for now.  -Patient advised to return or notify health care team  if symptoms worsen ,persist or new concerns arise.  Patient Instructions  This acts like a post infection   Airway/ lung irritability   Condition   Thus the infection is gone but   Still hyper sensitive airway  desnt seem like  Bacterial infection cause  Exam is good no fever and no  Infected looking  Mucous.   This condition  Can be aggravated by reflux also   Sugar free candy oistead of cough drops. 5 days of prednisone   Add  Back ranitidine  150 mg 1 twice a day for  3-4 weeks and if better can go off.  Fu if cough not resolving in the next  3 weeks  Or fever  Relapsing sx .     Standley Brooking. Panosh M.D.

## 2016-10-30 ENCOUNTER — Encounter: Payer: Self-pay | Admitting: Internal Medicine

## 2016-10-30 ENCOUNTER — Ambulatory Visit (INDEPENDENT_AMBULATORY_CARE_PROVIDER_SITE_OTHER): Payer: 59 | Admitting: Internal Medicine

## 2016-10-30 VITALS — BP 110/70 | HR 101 | Temp 98.7°F | Ht 70.5 in | Wt 199.8 lb

## 2016-10-30 DIAGNOSIS — J22 Unspecified acute lower respiratory infection: Secondary | ICD-10-CM

## 2016-10-30 MED ORDER — DOXYCYCLINE HYCLATE 100 MG PO TABS: 100.0000 mg | ORAL_TABLET | Freq: Two times a day (BID) | ORAL | 0 refills | Status: DC

## 2016-10-30 NOTE — Progress Notes (Signed)
Chief Complaint  Patient presents with  . Fever    started this weekend  . Sore Throat  . Nasal Congestion  . Cough    HPI: Kenneth [name] 51 y.o.  sda  Here with wife   Seen early march for  Post infectious cough  rx with pred and  Other parameters  Was better but had some throat clearing and then 2 days ago  Sat had fever  100.    Feeling bad all over   Throat pain in am .  And ur congestion and cough  No sob  Has rash upper arms when gets fever  Always     ROS: See pertinent positives and negatives per HPI. No face pain but congestion  mucinex and  advil given   Past Medical History:  Diagnosis Date  . GERD (gastroesophageal reflux disease)   . Hyperlipidemia   . Nephrolithiasis 08/2008   left    Family History  Problem Relation Age of Onset  . Cancer Mother        eye  . Colon cancer Neg Hx     Social History   Social History  . Marital status: Married    Spouse name: N/A  . Number of children: N/A  . Years of education: N/A   Social History Main Topics  . Smoking status: Never Smoker  . Smokeless tobacco: Never Used  . Alcohol use Yes     Comment: socially; currently none due to symptoms  . Drug use: No  . Sexual activity: Not Asked   Other Topics Concern  . 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 predniSONE (DELTASONE) 20 MG tablet Take 3 po qd for 2 days then 2 po qd for 3 days,or as directed 12 tablet 0  . ranitidine (ZANTAC) 150 MG tablet Take 1 tablet (150 mg total) by mouth 2 (two) times daily. 60 tablet 1   No facility-administered medications prior to visit.      EXAM:  BP 110/70 (BP Location: Right Arm, Patient Position: Sitting, Cuff Size: Normal)   Pulse (!) 101   Temp 98.7 F (37.1 C) (Oral)   Ht 5' 10.5" (1.791 m)   Wt 199 lb 12.8 oz (90.6 kg)   SpO2 97%   BMI 28.26 kg/m   Body mass index  is 28.26 kg/m. WDWN in NAD  quiet respirations; congested  somewhat hoarse. Non toxic . HEENT: Normocephalic ;atraumatic , Eyes;  PERRL, EOMs  Full, lids and conjunctiva clear,,Ears: no deformities, canals nl, TM landmarks normal, Nose: no deformity or discharge but congested;non  minimally tender Mouth : OP clear without lesion or edema .red  Neck: Supple without adenopathy or masses or bruits Chest:  Clear to A&P without wheezes rales or rhonchi CV:  S1-S2 no gallops or murmurs peripheral perfusion is normal Skin :nl perfusion upper arm rash ( he says he gets with fever fo the last 10 years)  No edema     ASSESSMENT AND PLAN:  Discussed the following assessment and plan:  Acute respiratory infection - w fever.  see text . Family worried about  Bacteria infection  Exam reassuring disc indications and requeted  print rx  Incase needed and call or my char t with  Concerns    If persiste fever contact us  For eval.   -Patient advised to return or notify health care team  if symptoms worsen ,persist or new concerns arise.  Patient Instructions  Your exam is reassuring with a normal lung exam today This still acts like a viral upper respiratory head cold chest cold. However the fever should resolve fairly soon. If the fever is still present Wednesday am  100 100s 0.3 contact us for reevaluation Antibiotics usually are not helpful for sinus infections unless  prolong symptoms, severe pain or other indications otherwise saline Flonase decongestants as needed.  I can printout antibiotic to be used if needed contact us at the end of this week if symptoms are not starting to improve.    Standley Brooking. Javius Sylla M.D.

## 2016-10-30 NOTE — Patient Instructions (Signed)
Your exam is reassuring with a normal lung exam today This still acts like a viral upper respiratory head cold chest cold. However the fever should resolve fairly soon. If the fever is still present Wednesday am  100 100s 0.3 contact us for reevaluation Antibiotics usually are not helpful for sinus infections unless  prolong symptoms, severe pain or other indications otherwise saline Flonase decongestants as needed.  I can printout antibiotic to be used if needed contact us at the end of this week if symptoms are not starting to improve.

## 2016-12-13 ENCOUNTER — Encounter: Payer: Self-pay | Admitting: Gastroenterology

## 2017-01-08 NOTE — Progress Notes (Signed)
Chief Complaint  Patient presents with  . Annual Exam    HPI: Patient  Kenneth [name]  51 y.o. comes in today for Preventive Health Care visit  Cough gone  Neg fam hx colon cancer prostate cancer  No current problem with heartburn Health Maintenance  Topic Date Due  . HIV Screening  01/31/2017 (Originally 01/23/1981)  . INFLUENZA VACCINE  01/17/2017  . TETANUS/TDAP  06/10/2017   Health Maintenance Review LIFESTYLE:  Exercise:   Walks a a lot at work  Tobacco/ETS: no Alcohol:  Moderate  Sugar beverages: Sleep: 8 hours  Drug use: no   HH of  4  No pets  Work: about 50 hours     ROS:   Colon screen  Pos sis  GEN/ HEENT: No fever, significant weight changes sweats headaches vision problems hearing changes, CV/ PULM; No chest pain shortness of breath cough, syncope,edema  change in exercise tolerance. GI /GU: No adominal pain, vomiting, change in bowel habits. No blood in the stool. No significant GU symptoms. SKIN/HEME: ,no acute skin rashes suspicious lesions or bleeding. No lymphadenopathy, nodules, masses.  NEURO/ PSYCH:  No neurologic signs such as weakness numbness. No depression anxiety. IMM/ Allergy: No unusual infections.  Allergy .   REST of 12 system review negative except as per HPI   Past Medical History:  Diagnosis Date  . GERD (gastroesophageal reflux disease)   . Hyperlipidemia   . Nephrolithiasis 08/2008   left    Past Surgical History:  Procedure Laterality Date  . CHOLECYSTECTOMY, LAPAROSCOPIC     2012    Family History  Problem Relation Age of Onset  . Cancer Mother        eye  . Colon cancer Neg Hx    Gyne cancer     Mom also  Social History   Social History  . Marital status: Married    Spouse name: N/A  . Number of children: N/A  . Years of education: N/A   Social History Main Topics  . Smoking status: Never Smoker  . Smokeless tobacco: Never Used  . Alcohol use Yes     Comment: socially; currently none due to symptoms  .  Drug use: No  . Sexual activity: Not Asked   Other Topics Concern  . 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 doxycycline (VIBRA-TABS) 100 MG tablet Take 1 tablet (100 mg total) by mouth 2 (two) times daily. 14 tablet 0   No facility-administered medications prior to visit.      EXAM:  BP 110/80 (BP Location: Right Arm, Patient Position: Sitting, Cuff Size: Normal)   Pulse 92   Temp 98.5 F (36.9 C) (Oral)   Ht 5' 10"  (1.778 m)   Wt 199 lb 6.4 oz (90.4 kg)   BMI 28.61 kg/m   Body mass index is 28.61 kg/m. Wt Readings from Last 3 Encounters:  01/09/17 199 lb 6.4 oz (90.4 kg)  10/30/16 199 lb 12.8 oz (90.6 kg)  09/04/16 206 lb 9.6 oz (93.7 kg)    Physical Exam: Vital signs reviewed PJS:RPRX is a well-developed well-nourished alert cooperative    who appearsr stated age in no acute distress.  HEENT: normocephalic atraumatic , Eyes: PERRL EOM's full, conjunctiva clear, Nares: paten,t no deformity discharge or tenderness., Ears: no deformity EAC's  clear TMs with normal landmarks. Mouth: clear OP, no lesions, edema.  Moist mucous membranes. Dentition in adequate repair. NECK: supple without masses, thyromegaly or bruits. CHEST/PULM:  Clear to auscultation and percussion breath sounds equal no wheeze , rales or rhonchi. No chest wall deformities or tenderness. Breast: normal by inspection . No dimpling, discharge, masses, tenderness or discharge . CV: PMI is nondisplaced, S1 S2 no gallops, murmurs, rubs. Peripheral pulses are full without delay.No JVD .  ABDOMEN: Bowel sounds normal nontender  No guard or rebound, no hepato splenomegal no CVA tenderness.  No hernia. Extremtities:  No clubbing cyanosis or edema, no acute joint swelling or redness no focal atrophy left index finger looks normal  No crepitus but rom  Slightly  dec on flexion   NEURO:   Oriented x3, cranial nerves 3-12 appear to be intact, no obvious focal weakness,gait within normal limits no abnormal reflexes or asymmetrical SKIN: No acute rashes normal turgor, color, no bruising or petechiae. Toe nail 2 and 2 fn have some  thickening left ring with green deeper .  No psoriatic rash  PSYCH: Oriented, good eye contact, no obvious depression anxiety, cognition and judgment appear normal. LN: no cervical axillary inguinal adenopathy  Lab Results  Component Value Date   WBC 5.7 01/25/2016   HGB 16.5 01/25/2016   HCT 47.2 01/25/2016   PLT 212.0 01/25/2016   GLUCOSE 99 01/25/2016   CHOL 163 01/25/2016   TRIG 90.0 01/25/2016   HDL 37.60 (L) 01/25/2016   LDLCALC 107 (H) 01/25/2016   ALT 31 01/25/2016   AST 19 01/25/2016   NA 140 01/25/2016   K 4.1 01/25/2016   CL 105 01/25/2016   CREATININE 1.03 01/25/2016   BUN 23 01/25/2016   CO2 27 01/25/2016   TSH 2.18 01/25/2016    BP Readings from Last 3 Encounters:  01/09/17 110/80  10/30/16 110/70  09/04/16 112/78    Lab results reviewed with patient   ASSESSMENT AND PLAN:  Discussed the following assessment and plan:  Visit for preventive health examination - Plan: CBC with Differential/Platelet, Hepatic function panel, Basic metabolic panel, Lipid panel  Low HDL (under 40) - Plan: CBC with Differential/Platelet, Hepatic function panel, Basic metabolic panel, Lipid panel  Nail deformity - rx for fungus and then told ? psoriateic but no hx psoriasis  Counseled. Can bring in form for work after lab back Patient Care Team: Anzley Dibbern, Standley Brooking, MD as PCP - General [facility] Plan Pricilla Riffle, MD (Gastroenterology) Izora Ribas (Dermatology) Patient Instructions  Avoid excessive forces and trauma but if finger gets stuck trigger finger  We can get   appt with hand provider  As discussed .    Will notify you  of labs when available.    Preventive Care 40-64 Years, Male Preventive care refers to lifestyle choices and visits  with your health care provider that can promote health and wellness. What does preventive care include?  A yearly physical exam. This is also called an annual well check.  Dental exams once or twice a year.  Routine eye exams. Ask your health care provider how often you should have your eyes checked.  Personal lifestyle choices, including: ? Daily care of your teeth and gums. ? Regular physical activity. ? Eating a healthy diet. ? Avoiding tobacco and drug use. ? Limiting alcohol use. ? Practicing safe sex. ? Taking low-dose aspirin every day starting at age 41. What happens during an annual well check? The services and screenings done by your  health care provider during your annual well check will depend on your age, overall health, lifestyle risk factors, and family history of disease. Counseling Your health care provider may ask you questions about your:  Alcohol use.  Tobacco use.  Drug use.  Emotional well-being.  Home and relationship well-being.  Sexual activity.  Eating habits.  Work and work Statistician.  Screening You may have the following tests or measurements:  Height, weight, and BMI.  Blood pressure.  Lipid and cholesterol levels. These may be checked every 5 years, or more frequently if you are over 17 years old.  Skin check.  Lung cancer screening. You may have this screening every year starting at age 74 if you have a 30-pack-year history of smoking and currently smoke or have quit within the past 15 years.  Fecal occult blood test (FOBT) of the stool. You may have this test every year starting at age 70.  Flexible sigmoidoscopy or colonoscopy. You may have a sigmoidoscopy every 5 years or a colonoscopy every 10 years starting at age 9.  Prostate cancer screening. Recommendations will vary depending on your family history and other risks.  Hepatitis C blood test.  Hepatitis B blood test.  Sexually transmitted disease (STD)  testing.  Diabetes screening. This is done by checking your blood sugar (glucose) after you have not eaten for a while (fasting). You may have this done every 1-3 years.  Discuss your test results, treatment options, and if necessary, the need for more tests with your health care provider. Vaccines Your health care provider may recommend certain vaccines, such as:  Influenza vaccine. This is recommended every year.  Tetanus, diphtheria, and acellular pertussis (Tdap, Td) vaccine. You may need a Td booster every 10 years.  Varicella vaccine. You may need this if you have not been vaccinated.  Zoster vaccine. You may need this after age 53.  Measles, mumps, and rubella (MMR) vaccine. You may need at least one dose of MMR if you were born in 1957 or later. You may also need a second dose.  Pneumococcal 13-valent conjugate (PCV13) vaccine. You may need this if you have certain conditions and have not been vaccinated.  Pneumococcal polysaccharide (PPSV23) vaccine. You may need one or two doses if you smoke cigarettes or if you have certain conditions.  Meningococcal vaccine. You may need this if you have certain conditions.  Hepatitis A vaccine. You may need this if you have certain conditions or if you travel or work in places where you may be exposed to hepatitis A.  Hepatitis B vaccine. You may need this if you have certain conditions or if you travel or work in places where you may be exposed to hepatitis B.  Haemophilus influenzae type b (Hib) vaccine. You may need this if you have certain risk factors.  Talk to your health care provider about which screenings and vaccines you need and how often you need them. This information is not intended to replace advice given to you by your health care provider. Make sure you discuss any questions you have with your health care provider. Document Released: 07/02/2015 Document Revised: 02/23/2016 Document Reviewed: 04/06/2015 Elsevier  Interactive Patient Education  2017 [place] [employer]. Keilana Morlock M.D.

## 2017-01-09 ENCOUNTER — Encounter: Payer: Self-pay | Admitting: Internal Medicine

## 2017-01-09 ENCOUNTER — Ambulatory Visit (INDEPENDENT_AMBULATORY_CARE_PROVIDER_SITE_OTHER): Payer: 59 | Admitting: Internal Medicine

## 2017-01-09 VITALS — BP 110/80 | HR 92 | Temp 98.5°F | Ht 70.0 in | Wt 199.4 lb

## 2017-01-09 DIAGNOSIS — Z0000 Encounter for general adult medical examination without abnormal findings: Secondary | ICD-10-CM

## 2017-01-09 DIAGNOSIS — L608 Other nail disorders: Secondary | ICD-10-CM | POA: Diagnosis not present

## 2017-01-09 DIAGNOSIS — E786 Lipoprotein deficiency: Secondary | ICD-10-CM | POA: Diagnosis not present

## 2017-01-09 LAB — BASIC METABOLIC PANEL
BUN: 25 mg/dL — AB (ref 6–23)
CO2: 30 mEq/L (ref 19–32)
CREATININE: 1.11 mg/dL (ref 0.40–1.50)
Calcium: 10 mg/dL (ref 8.4–10.5)
Chloride: 104 mEq/L (ref 96–112)
GFR: 74.54 mL/min (ref >60.00)
Glucose, Bld: 101 mg/dL — ABNORMAL HIGH (ref 70–99)
Potassium: 4.7 mEq/L (ref 3.5–5.1)
Sodium: 140 mEq/L (ref 135–145)

## 2017-01-09 LAB — CBC WITH DIFFERENTIAL/PLATELET
BASOS ABS: 0 10*3/uL (ref 0.0–0.1)
Basophils Relative: 0.5 % (ref 0.0–3.0)
EOS ABS: 0.1 10*3/uL (ref 0.0–0.7)
Eosinophils Relative: 1.6 % (ref 0.0–5.0)
HCT: 49 % (ref 39.0–52.0)
Hemoglobin: 17 g/dL (ref 13.0–17.0)
LYMPHS ABS: 1.2 10*3/uL (ref 0.7–4.0)
Lymphocytes Relative: 23.4 % (ref 12.0–46.0)
MCHC: 34.6 g/dL (ref 30.0–36.0)
MCV: 92.7 fl (ref 78.0–100.0)
Monocytes Absolute: 0.5 10*3/uL (ref 0.1–1.0)
Monocytes Relative: 8.8 % (ref 3.0–12.0)
NEUTROS ABS: 3.5 10*3/uL (ref 1.4–7.7)
NEUTROS PCT: 65.7 % (ref 43.0–77.0)
PLATELETS: 219 10*3/uL (ref 150.0–400.0)
RBC: 5.28 Mil/uL (ref 4.22–5.81)
RDW: 13.7 % (ref 11.5–15.5)
WBC: 5.3 10*3/uL (ref 4.0–10.5)

## 2017-01-09 LAB — HEPATIC FUNCTION PANEL
ALBUMIN: 4.7 g/dL (ref 3.5–5.2)
ALK PHOS: 73 U/L (ref 39–117)
ALT: 27 U/L (ref 0–53)
AST: 17 U/L (ref 0–37)
BILIRUBIN DIRECT: 0.2 mg/dL (ref 0.0–0.3)
Total Bilirubin: 1.2 mg/dL (ref 0.2–1.2)
Total Protein: 7 g/dL (ref 6.0–8.3)

## 2017-01-09 LAB — LIPID PANEL
CHOLESTEROL: 157 mg/dL (ref 0–200)
HDL: 37.9 mg/dL — ABNORMAL LOW (ref >39.00)
LDL Cholesterol: 103 mg/dL — ABNORMAL HIGH (ref 0–99)
NonHDL: 118.69
TRIGLYCERIDES: 80 mg/dL (ref 0.0–149.0)
Total CHOL/HDL Ratio: 4
VLDL: 16 mg/dL (ref 0.0–40.0)

## 2017-01-09 NOTE — Patient Instructions (Addendum)
Avoid excessive forces and trauma but if finger gets stuck trigger finger  We can get   appt with hand provider  As discussed .    Will notify you  of labs when available.    Preventive Care 40-64 Years, Male Preventive care refers to lifestyle choices and visits with your health care provider that can promote health and wellness. What does preventive care include?  A yearly physical exam. This is also called an annual well check.  Dental exams once or twice a year.  Routine eye exams. Ask your health care provider how often you should have your eyes checked.  Personal lifestyle choices, including: ? Daily care of your teeth and gums. ? Regular physical activity. ? Eating a healthy diet. ? Avoiding tobacco and drug use. ? Limiting alcohol use. ? Practicing safe sex. ? Taking low-dose aspirin every day starting at age 4. What happens during an annual well check? The services and screenings done by your health care provider during your annual well check will depend on your age, overall health, lifestyle risk factors, and family history of disease. Counseling Your health care provider may ask you questions about your:  Alcohol use.  Tobacco use.  Drug use.  Emotional well-being.  Home and relationship well-being.  Sexual activity.  Eating habits.  Work and work Statistician.  Screening You may have the following tests or measurements:  Height, weight, and BMI.  Blood pressure.  Lipid and cholesterol levels. These may be checked every 5 years, or more frequently if you are over 85 years old.  Skin check.  Lung cancer screening. You may have this screening every year starting at age 33 if you have a 30-pack-year history of smoking and currently smoke or have quit within the past 15 years.  Fecal occult blood test (FOBT) of the stool. You may have this test every year starting at age 55.  Flexible sigmoidoscopy or colonoscopy. You may have a sigmoidoscopy every 5  years or a colonoscopy every 10 years starting at age 16.  Prostate cancer screening. Recommendations will vary depending on your family history and other risks.  Hepatitis C blood test.  Hepatitis B blood test.  Sexually transmitted disease (STD) testing.  Diabetes screening. This is done by checking your blood sugar (glucose) after you have not eaten for a while (fasting). You may have this done every 1-3 years.  Discuss your test results, treatment options, and if necessary, the need for more tests with your health care provider. Vaccines Your health care provider may recommend certain vaccines, such as:  Influenza vaccine. This is recommended every year.  Tetanus, diphtheria, and acellular pertussis (Tdap, Td) vaccine. You may need a Td booster every 10 years.  Varicella vaccine. You may need this if you have not been vaccinated.  Zoster vaccine. You may need this after age 73.  Measles, mumps, and rubella (MMR) vaccine. You may need at least one dose of MMR if you were born in 1957 or later. You may also need a second dose.  Pneumococcal 13-valent conjugate (PCV13) vaccine. You may need this if you have certain conditions and have not been vaccinated.  Pneumococcal polysaccharide (PPSV23) vaccine. You may need one or two doses if you smoke cigarettes or if you have certain conditions.  Meningococcal vaccine. You may need this if you have certain conditions.  Hepatitis A vaccine. You may need this if you have certain conditions or if you travel or work in places where you may be  exposed to hepatitis A.  Hepatitis B vaccine. You may need this if you have certain conditions or if you travel or work in places where you may be exposed to hepatitis B.  Haemophilus influenzae type b (Hib) vaccine. You may need this if you have certain risk factors.  Talk to your health care provider about which screenings and vaccines you need and how often you need them. This information is not  intended to replace advice given to you by your health care provider. Make sure you discuss any questions you have with your health care provider. Document Released: 07/02/2015 Document Revised: 02/23/2016 Document Reviewed: 04/06/2015 Elsevier Interactive Patient Education  2017 [employer].

## 2017-01-10 ENCOUNTER — Telehealth: Payer: Self-pay | Admitting: Internal Medicine

## 2017-01-10 NOTE — Telephone Encounter (Signed)
Pt dropped off physical form Call for pick-up [phone] Disposition: Dr's folder

## 2017-01-19 ENCOUNTER — Encounter: Payer: Self-pay | Admitting: Internal Medicine

## 2017-01-26 ENCOUNTER — Telehealth: Payer: Self-pay | Admitting: Emergency Medicine

## 2017-01-26 NOTE — Telephone Encounter (Signed)
Left a VM for patient regarding physical examination form and whether or not his job received the fax

## 2017-03-09 ENCOUNTER — Encounter: Payer: Self-pay | Admitting: Internal Medicine

## 2018-05-07 DIAGNOSIS — L570 Actinic keratosis: Secondary | ICD-10-CM | POA: Diagnosis not present

## 2018-05-07 DIAGNOSIS — D225 Melanocytic nevi of trunk: Secondary | ICD-10-CM | POA: Diagnosis not present

## 2018-05-07 DIAGNOSIS — L408 Other psoriasis: Secondary | ICD-10-CM | POA: Diagnosis not present

## 2018-07-04 DIAGNOSIS — L570 Actinic keratosis: Secondary | ICD-10-CM | POA: Diagnosis not present

## 2018-08-30 DIAGNOSIS — L570 Actinic keratosis: Secondary | ICD-10-CM | POA: Diagnosis not present

## 2019-04-11 ENCOUNTER — Other Ambulatory Visit: Payer: Self-pay

## 2019-04-11 ENCOUNTER — Emergency Department (HOSPITAL_COMMUNITY)
Admission: EM | Admit: 2019-04-11 | Discharge: 2019-04-11 | Disposition: A | Discharge status: Home / Self Care | Payer: 59 | Attending: Emergency Medicine | Admitting: Emergency Medicine

## 2019-04-11 ENCOUNTER — Emergency Department (HOSPITAL_COMMUNITY): Payer: 59

## 2019-04-11 ENCOUNTER — Encounter (HOSPITAL_COMMUNITY): Payer: Self-pay | Admitting: Emergency Medicine

## 2019-04-11 DIAGNOSIS — N200 Calculus of kidney: Secondary | ICD-10-CM | POA: Diagnosis not present

## 2019-04-11 DIAGNOSIS — N23 Unspecified renal colic: Secondary | ICD-10-CM | POA: Insufficient documentation

## 2019-04-11 DIAGNOSIS — N1330 Unspecified hydronephrosis: Secondary | ICD-10-CM | POA: Diagnosis not present

## 2019-04-11 DIAGNOSIS — R1031 Right lower quadrant pain: Secondary | ICD-10-CM | POA: Diagnosis present

## 2019-04-11 LAB — CBC WITH DIFFERENTIAL/PLATELET
Abs Immature Granulocytes: 0.04 10*3/uL (ref 0.00–0.07)
Basophils Absolute: 0.1 10*3/uL (ref 0.0–0.1)
Basophils Relative: 1 %
Eosinophils Absolute: 0 10*3/uL (ref 0.0–0.5)
Eosinophils Relative: 0 %
HCT: 50.7 % (ref 39.0–52.0)
Hemoglobin: 17 g/dL (ref 13.0–17.0)
Immature Granulocytes: 0 %
Lymphocytes Relative: 15 %
Lymphs Abs: 1.9 10*3/uL (ref 0.7–4.0)
MCH: 31.2 pg (ref 26.0–34.0)
MCHC: 33.5 g/dL (ref 30.0–36.0)
MCV: 93 fL (ref 80.0–100.0)
Monocytes Absolute: 1.1 10*3/uL — ABNORMAL HIGH (ref 0.1–1.0)
Monocytes Relative: 8 %
Neutro Abs: 9.9 10*3/uL — ABNORMAL HIGH (ref 1.7–7.7)
Neutrophils Relative %: 76 %
Platelets: 243 10*3/uL (ref 150–400)
RBC: 5.45 MIL/uL (ref 4.22–5.81)
RDW: 12.9 % (ref 11.5–15.5)
WBC: 13.1 10*3/uL — ABNORMAL HIGH (ref 4.0–10.5)
nRBC: 0 % (ref 0.0–0.2)

## 2019-04-11 LAB — COMPREHENSIVE METABOLIC PANEL
ALT: 38 U/L (ref 0–44)
AST: 30 U/L (ref 15–41)
Albumin: 5.1 g/dL — ABNORMAL HIGH (ref 3.5–5.0)
Alkaline Phosphatase: 75 U/L (ref 38–126)
Anion gap: 11 (ref 5–15)
BUN: 21 mg/dL — ABNORMAL HIGH (ref 6–20)
CO2: 24 mmol/L (ref 22–32)
Calcium: 10.1 mg/dL (ref 8.9–10.3)
Chloride: 107 mmol/L (ref 98–111)
Creatinine, Ser: 1.59 mg/dL — ABNORMAL HIGH (ref 0.61–1.24)
GFR calc Af Amer: 57 mL/min — ABNORMAL LOW (ref >60)
GFR calc non Af Amer: 49 mL/min — ABNORMAL LOW (ref >60)
Glucose, Bld: 123 mg/dL — ABNORMAL HIGH (ref 70–99)
Potassium: 4.1 mmol/L (ref 3.5–5.1)
Sodium: 142 mmol/L (ref 135–145)
Total Bilirubin: 1.4 mg/dL — ABNORMAL HIGH (ref 0.3–1.2)
Total Protein: 8.5 g/dL — ABNORMAL HIGH (ref 6.5–8.1)

## 2019-04-11 LAB — URINALYSIS, ROUTINE W REFLEX MICROSCOPIC
Bacteria, UA: NONE SEEN
Bilirubin Urine: NEGATIVE
Glucose, UA: NEGATIVE mg/dL
Ketones, ur: 20 mg/dL — AB
Leukocytes,Ua: NEGATIVE
Nitrite: NEGATIVE
Protein, ur: 30 mg/dL — AB
Specific Gravity, Urine: 1.024 (ref 1.005–1.030)
pH: 5 (ref 5.0–8.0)

## 2019-04-11 MED ORDER — SODIUM CHLORIDE 0.9 % IV BOLUS
1000.0000 mL | Freq: Once | INTRAVENOUS | Status: AC
  Administered 2019-04-11: 1000 mL via INTRAVENOUS

## 2019-04-11 MED ORDER — HYDROMORPHONE HCL 1 MG/ML IJ SOLN
1.0000 mg | Freq: Once | INTRAMUSCULAR | Status: AC
  Administered 2019-04-11: 1 mg via INTRAVENOUS
  Filled 2019-04-11: qty 1

## 2019-04-11 MED ORDER — MORPHINE SULFATE (PF) 4 MG/ML IV SOLN
4.0000 mg | Freq: Once | INTRAVENOUS | Status: AC
  Administered 2019-04-11: 4 mg via INTRAVENOUS
  Filled 2019-04-11: qty 1

## 2019-04-11 MED ORDER — KETOROLAC TROMETHAMINE 15 MG/ML IJ SOLN
15.0000 mg | Freq: Once | INTRAMUSCULAR | Status: AC
  Administered 2019-04-11: 15 mg via INTRAVENOUS
  Filled 2019-04-11: qty 1

## 2019-04-11 MED ORDER — IBUPROFEN 600 MG PO TABS: 600.0000 mg | ORAL_TABLET | Freq: Four times a day (QID) | ORAL | 0 refills | Status: AC | PRN

## 2019-04-11 NOTE — ED Triage Notes (Signed)
Pt c/o right flank pain that started couple hours ago. Reports started having trouble urinating around 1p today. Hx kidney stones. Pt is diaphoretic in triage.

## 2019-04-11 NOTE — ED Notes (Signed)
Pt asked to provide urine specimen. Pt has urinal at bedside.

## 2019-04-11 NOTE — ED Notes (Signed)
Pt denies needing to void at this time.

## 2019-04-11 NOTE — ED Notes (Signed)
Pt back from CT. Pt requesting pain meds. Grandville Silos, RN notified.

## 2019-04-11 NOTE — ED Provider Notes (Signed)
[facility] DEPT Provider Note   CSN: [id number] Arrival date & time: 04/11/19  [id number]     History   Chief Complaint Chief Complaint  Patient presents with  . Flank Pain    HPI Kenneth [name] is a 53 y.o. male with a past medical history of kidney stones presenting to emergency department with abrupt onset right flank pain and right lower abdominal pain.  He states his symptoms began approximately 1 hour prior to his arrival.  His pain was abrupt onset, stabbing pain in his right flank that radiates around towards his groin.  He says this feels exactly like his kidney stone felt 10 years ago.  He has a history of passing 1 kidney stone, which did not require surgical intervention.  He says the pain has been constant for the past hour.  It is 10 out of 10.  He is diaphoretic.  He does not feel nauseated, and is not having vomiting.  He has not urinated since the pain began.  He states it feels like he needs to pee.  He has no drug allergies.  He takes no medications at baseline.  He reports no other medical history.  He denies any personal or family history of aortic aneurysm.  He is not a smoker.  He reports a surgical history of cholecystectomy.     HPI  Past Medical History:  Diagnosis Date  . GERD (gastroesophageal reflux disease)   . Hyperlipidemia   . Nephrolithiasis 08/2008   left    Patient Active Problem List   Diagnosis Date Noted  . Nail deformity 01/09/2017  . Visit for preventive health examination 03/18/2013  . Psoriasis 03/18/2013  . Preventative health care 02/06/2011  . Constipation 09/13/2010  . Abnormal findings on radiological examination of gastrointestinal tract 09/13/2010  . GERD (gastroesophageal reflux disease) 09/13/2010  . NEOPLASM, SKIN, UNCERTAIN BEHAVIOR [id number]  . 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 MICROSCOPIC HEMATURIA 04/28/2009  . NEPHROLITHIASIS, HX OF 04/28/2009  . OTHER SPECIFIED DISEASE OF NAIL  04/06/2009  . ARTHRALGIA 06/16/2008  . KNEE PAIN, RIGHT 06/16/2008  . Other and unspecified hyperlipidemia 06/11/2007  . GERD 06/11/2007    Past Surgical History:  Procedure Laterality Date  . CHOLECYSTECTOMY, LAPAROSCOPIC     2012        Home Medications    Prior to Admission medications   Medication Sig Start Date End Date Taking? Authorizing Provider  ibuprofen (ADVIL) 600 MG tablet Take 1 tablet (600 mg total) by mouth every 6 (six) hours as needed for up to 7 days. 04/11/19 04/18/19  Wyvonnia Dusky, MD    Family History Family History  Problem Relation Age of Onset  . Cancer Mother        eye  . Colon cancer Neg Hx     Social History Social History   Tobacco Use  . Smoking status: Never Smoker  . Smokeless tobacco: Never Used  Substance Use Topics  . Alcohol use: Yes    Comment: socially; currently none due to symptoms  . Drug use: No     Allergies   Sulfonamide derivatives   Review of Systems Review of Systems  Constitutional: Positive for diaphoresis. Negative for chills and fever.  Respiratory: Negative for cough and shortness of breath.   Cardiovascular: Negative for chest pain and palpitations.  Gastrointestinal: Positive for abdominal pain. Negative for nausea and vomiting.  Genitourinary: Positive for flank pain, testicular pain and urgency. Negative for difficulty urinating, dysuria, hematuria,  penile pain and scrotal swelling.  Skin: Negative for pallor and rash.  Neurological: Negative for syncope and headaches.  Psychiatric/Behavioral: Negative for agitation and confusion.  All other systems reviewed and are negative.    Physical Exam Updated Vital Signs BP (!) 169/151   Pulse 98   Temp 98.6 F (37 C) (Oral)   Resp 14   SpO2 97%   Physical Exam Vitals signs and nursing note reviewed.  Constitutional:      General: He is in acute distress.     Appearance: He is well-developed. He is diaphoretic.  HENT:     Head:  Normocephalic and atraumatic.  Eyes:     Conjunctiva/sclera: Conjunctivae normal.  Neck:     Musculoskeletal: Neck supple.  Cardiovascular:     Rate and Rhythm: Regular rhythm. Tachycardia present.     Pulses: Normal pulses.  Pulmonary:     Effort: Pulmonary effort is normal. No respiratory distress.     Breath sounds: Normal breath sounds.  Abdominal:     General: There is no distension.     Palpations: Abdomen is soft. There is no mass.     Tenderness: There is no abdominal tenderness. There is no guarding or rebound. Negative signs include McBurney's sign.     Hernia: No hernia is present.  Genitourinary:    Comments: Circumsized male Penis is unremarkable, no urethral discharge Testicles are equal bilaterally, nontender, normal cremastric reflex, no epididymal tenderness Skin:    General: Skin is warm.  Neurological:     Mental Status: He is alert.  Psychiatric:        Mood and Affect: Mood normal.        Behavior: Behavior normal.      ED Treatments / Results  Labs (all labs ordered are listed, but only abnormal results are displayed) Labs Reviewed  URINALYSIS, ROUTINE W REFLEX MICROSCOPIC - Abnormal; Notable for the following components:      Result Value   Hgb urine dipstick LARGE (*)    Ketones, ur 20 (*)    Protein, ur 30 (*)    All other components within normal limits  COMPREHENSIVE METABOLIC PANEL - Abnormal; Notable for the following components:   Glucose, Bld 123 (*)    BUN 21 (*)    Creatinine, Ser 1.59 (*)    Total Protein 8.5 (*)    Albumin 5.1 (*)    Total Bilirubin 1.4 (*)    GFR calc non Af Amer 49 (*)    GFR calc Af Amer 57 (*)    All other components within normal limits  CBC WITH DIFFERENTIAL/PLATELET - Abnormal; Notable for the following components:   WBC 13.1 (*)    Neutro Abs 9.9 (*)    Monocytes Absolute 1.1 (*)    All other components within normal limits    EKG None  Radiology Ct Renal Stone Study  Result Date: 04/11/2019  CLINICAL DATA:  Right flank pain EXAM: CT ABDOMEN AND PELVIS WITHOUT CONTRAST TECHNIQUE: Multidetector CT imaging of the abdomen and pelvis was performed following the standard protocol without IV contrast. COMPARISON:  09/12/2008, 06/09/2009 FINDINGS: Lower chest: No acute abnormality. Hepatobiliary: No focal liver abnormality is seen. Status post cholecystectomy. No biliary dilatation. Pancreas: Unremarkable. No pancreatic ductal dilatation or surrounding inflammatory changes. Spleen: Normal in size without focal abnormality. Adrenals/Urinary Tract: Adrenal glands are normal. Mild right hydronephrosis and hydroureter, secondary to a punctate 1-2 mm stone at or just past the right UVJ. Stomach/Bowel: Stomach is within normal  limits. Appendix appears normal. No evidence of bowel wall thickening, distention, or inflammatory changes. Vascular/Lymphatic: No significant vascular findings are present. No enlarged abdominal or pelvic lymph nodes. Reproductive: Prostate is prominent in size. Other: No abdominal wall hernia or abnormality. No abdominopelvic ascites. Small fat in the umbilical region. Mild hazy infiltration of the central mesentery. Musculoskeletal: No acute or significant osseous findings. IMPRESSION: 1. Mild right hydronephrosis and hydroureter, secondary to a punctate 1-2 mm stone at or just past the right UVJ. 2. No other acute abnormalities are visualized. Electronically Signed   By: Donavan Foil M.D.   On: 04/11/2019 20:13    Procedures Procedures (including critical care time)  Medications Ordered in ED Medications  morphine 4 MG/ML injection 4 mg (4 mg Intravenous Given 04/11/19 1903)  ketorolac (TORADOL) 15 MG/ML injection 15 mg (15 mg Intravenous Given 04/11/19 1903)  sodium chloride 0.9 % bolus 1,000 mL (0 mLs Intravenous Stopped 04/11/19 2014)  HYDROmorphone (DILAUDID) injection 1 mg (1 mg Intravenous Given 04/11/19 2012)     Initial Impression / Assessment and Plan / ED Course   I have reviewed the triage vital signs and the nursing notes.  Pertinent labs & imaging results that were available during my care of the patient were reviewed by me and considered in my medical decision making (see chart for details).  53 yo male presenting to the ED with abrupt onset flank pain concerning for recurrent kidney stone.  He is diaphoretic and writhing on the bed.  Will give pain medications, IV morphine and toradol and IV fluids.    I was unable to adequately visualize his kidneys for hydronephrosis on bedside ultrasound, due to his body habitus and discomfort.  We will obtain a CT renal study instead.  Etiology of pain less likely appendicitis, GI perforation, AAA, acute surgical abdomen, testicular torsion or infection, or hernia, based on his history and my clinical exam.  This note was dictated using dragon dictation software.  Please be aware that there may be minor translation errors as a result of this oral dictation   Clinical Course as of Apr 11 1033  Fri Apr 11, 2019  2047 IMPRESSION: 1. Mild right hydronephrosis and hydroureter, secondary to a punctate 1-2 mm stone at or just past the right UVJ. 2. No other acute abnormalities are visualized.   [MT]  2305 No infection evident in urine, pain under control now, can discharge   [MT]  2338 Pt feels significantly better, may have passed stone into bladder, will discharge   [MT]    Clinical Course User Index [MT] Jerick Khachatryan, Carola Rhine, MD    Final Clinical Impressions(s) / ED Diagnoses   Final diagnoses:  Ureteral colic  Kidney stone    ED Discharge Orders         Ordered    ibuprofen (ADVIL) 600 MG tablet  Every 6 hours PRN     04/11/19 2341           Wyvonnia Dusky, MD 04/12/19 1034

## 2019-04-11 NOTE — ED Notes (Signed)
Pt transported to CT ?

## 2019-12-18 NOTE — Progress Notes (Signed)
Chief Complaint  Patient presents with  . Annual Exam    Doing okay    HPI: Patient  Kenneth [name]  54 y.o. comes in today for Preventive Health Care visit   Last cpx was 2018  Hd ureteral stone last fall  Right  Passed on own  Never saw urologist  Health Maintenance  Topic Date Due  . HIV Screening  Never done  . COLONOSCOPY  Never done  . TETANUS/TDAP  06/10/2017  . INFLUENZA VACCINE  01/18/2020  . Hepatitis C Screening  Completed   Health Maintenance Review LIFESTYLE:  Exercise:   wall and climb  at work Tobacco/ETS:n Alcohol: n Sugar beverages: ocass  coffee  Sleep: ok  Drug use: no HH of  3   No pets  Work: 4 x 10       ROS:  Left patellar area ocass burning pain at night not all the time  GEN/ HEENT: No fever, significant weight changes sweats headaches vision problems x may need readers  hearing changes, CV/ PULM; No chest pain shortness of breath cough, syncope,edema  change in exercise tolerance. GI /GU: No adominal pain, vomiting, change in bowel habits. No blood in the stool. More nocturia  SKIN/HEME: ,no acute skin rashes suspicious lesions or bleeding. No lymphadenopathy, nodules, masses.  NEURO/ PSYCH:  No neurologic signs such as weakness numbness. No depression anxiety. IMM/ Allergy: No unusual infections.  Allergy .   REST of 12 system review negative except as per HPI   Past Medical History:  Diagnosis Date  . GERD (gastroesophageal reflux disease)   . Hyperlipidemia   . Nephrolithiasis 08/2008   left    Past Surgical History:  Procedure Laterality Date  . CHOLECYSTECTOMY, LAPAROSCOPIC     2012    Family History  Problem Relation Age of Onset  . Cancer Mother        eye  . Colon cancer Neg Hx     Social History   Socioeconomic History  . Marital status: Married    Spouse name: Not on file  . Number of children: Not on file  . Years of education: Not on file  . Highest education level: Not on file  Occupational History  . Not  on file  Tobacco Use  . Smoking status: Never Smoker  . Smokeless tobacco: Never Used  Vaping Use  . Vaping Use: Never used  Substance and Sexual Activity  . Alcohol use: Yes    Comment: socially; currently none due to symptoms  . Drug use: No  . Sexual activity: Not on file  Other Topics Concern  . 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 Difficulty of Paying Living Expenses:   Food Insecurity:   . Worried About Charity fundraiser in the Last Year:   . Arboriculturist in the Last Year:   Transportation Needs:   . Film/video editor (Medical):   Marland Kitchen Lack of Transportation (Non-Medical):   Physical Activity:   . Days of Exercise per Week:   . Minutes of Exercise per Session:   Stress:   . Feeling of Stress :   Social Connections:   . Frequency of Communication with Friends and Family:   . Frequency  of Social Gatherings with Friends and Family:   . Attends Religious Services:   . Active Member of Clubs or Organizations:   . Attends Archivist Meetings:   Marland Kitchen Marital Status:     No outpatient medications prior to visit.   No facility-administered medications prior to visit.     EXAM:  BP 116/82   Pulse 95   Temp 98.2 F (36.8 C) (Temporal)   Ht 5' 9.75" (1.772 m)   Wt 195 lb 12.8 oz (88.8 kg)   SpO2 97%   BMI 28.30 kg/m   Body mass index is 28.3 kg/m. Wt Readings from Last 3 Encounters:  12/19/19 195 lb 12.8 oz (88.8 kg)  01/09/17 199 lb 6.4 oz (90.4 kg)  10/30/16 199 lb 12.8 oz (90.6 kg)    Physical Exam: Vital signs reviewed HDQ:QIWL is a well-developed well-nourished alert cooperative    who appearsr stated age in no acute distress.  HEENT: normocephalic atraumatic , Eyes: PERRL EOM's full, conjunctiva clear, Nares: paten,t no deformity discharge or tenderness., Ears: no deformity EAC's  clear TMs with normal landmarks. Mouth: clear OP, no lesions, edema.  Moist mucous membranes. Dentition in adequate repair. NECK: supple without masses, thyromegaly or bruits. CHEST/PULM:  Clear to auscultation and percussion breath sounds equal no wheeze , rales or rhonchi. No chest wall deformities or tenderness. Breast: normal by inspection . No dimpling, discharge, masses, tenderness or discharge . CV: PMI is nondisplaced, S1 S2 no gallops, murmurs, rubs. Peripheral pulses are full without delay.No JVD .  ABDOMEN: Bowel sounds normal nontender  No guard or rebound, no hepato splenomegal no CVA tenderness.  No hernia. Extremtities:  No clubbing cyanosis or edema, no acute joint swelling or redness no focal atrophy NEURO:  Oriented x3, cranial nerves 3-12 appear to be intact, no obvious focal weakness,gait within normal limits no abnormal reflexes or asymmetrical SKIN: No acute rashes normal turgor, color, no bruising or petechiae. PSYCH: Oriented, good eye contact, no obvious depression anxiety, cognition and judgment appear normal. LN: no cervical axillary inguinal adenopathy  Lab Results  Component Value Date   WBC 13.1 (H) 04/11/2019   HGB 17.0 04/11/2019   HCT 50.7 04/11/2019   PLT 243 04/11/2019   GLUCOSE 123 (H) 04/11/2019   CHOL 157 01/09/2017   TRIG 80.0 01/09/2017   HDL 37.90 (L) 01/09/2017   LDLCALC 103 (H) 01/09/2017   ALT 38 04/11/2019   AST 30 04/11/2019   NA 142 04/11/2019   K 4.1 04/11/2019   CL 107 04/11/2019   CREATININE 1.59 (H) 04/11/2019   BUN 21 (H) 04/11/2019   CO2 24 04/11/2019   TSH 2.18 01/25/2016    BP Readings from Last 3 Encounters:  12/19/19 116/82  04/11/19 (!) 169/151  01/09/17 110/80    Lab results reviewed with patient   ASSESSMENT AND PLAN:  Discussed the following assessment and plan: Last labs from ed visit for ureteral colic needs fu  With elevated creatinine    ICD-10-CM   1. Visit for preventive health examination  N98.92 Basic  metabolic panel    CBC with Differential/Platelet    Hemoglobin A1c    Hepatic function panel    Lipid panel    PSA    TSH    POCT Urinalysis Dipstick (Automated)  2. Special screening for malignant neoplasms, colon  Z12.11 Ambulatory referral to Gastroenterology  3. NEPHROLITHIASIS, HX OF  J19.417 Basic metabolic panel    CBC with Differential/Platelet    Hemoglobin A1c  Hepatic function panel    Lipid panel    PSA    TSH    POCT Urinalysis Dipstick (Automated)  4. Nocturia  D22.0 Basic metabolic panel    CBC with Differential/Platelet    Hemoglobin A1c    Hepatic function panel    Lipid panel    PSA    TSH    POCT Urinalysis Dipstick (Automated)  5. Screening PSA (prostate specific antigen)  Z12.5 PSA  hx of td reaction in past can make appt for  Different day   Will notify you  of labs when available.  Had covid vaccine  Colon screen  Neg fam hx but delayed  Refer for routine screen  ( wife doesn't drive )  Get  Eye check  Needs updated labs  Cr was up at the ed when had stone   Get ua and labs today ( fasting)  Shared Decision Making considier seeing urologist if  Appropriate    May have bph delayed rectal exam   Neg fam hx  Return in about 1 year (around 12/18/2020) for depending on results, cpx with labs.  Patient Care Team: Burnis Medin, MD as PCP - [facility] Pricilla Riffle, MD (Gastroenterology) Izora Ribas (Dermatology) Patient Instructions  Can make nurse appt for update tetanus booster ( td) doesn't mean   You will have same reaction.   Will notify you  of labs when available.   Colon cancer screening. Planned  . Get eye check .      Health Maintenance, Male Adopting a healthy lifestyle and getting preventive care are important in promoting health and wellness. Ask your health care provider about:  The right schedule for you to have regular tests and exams.  Things you can do on your own to prevent diseases and keep yourself healthy. What  should I know about diet, weight, and exercise? Eat a healthy diet   Eat a diet that includes plenty of vegetables, fruits, low-fat dairy products, and lean protein.  Do not eat a lot of foods that are high in solid fats, added sugars, or sodium. Maintain a healthy weight Body mass index (BMI) is a measurement that can be used to identify possible weight problems. It estimates body fat based on height and weight. Your health care provider can help determine your BMI and help you achieve or maintain a healthy weight. Get regular exercise Get regular exercise. This is one of the most important things you can do for your health. Most adults should:  Exercise for at least 150 minutes each week. The exercise should increase your heart rate and make you sweat (moderate-intensity exercise).  Do strengthening exercises at least twice a week. This is in addition to the moderate-intensity exercise.  Spend less time sitting. Even light physical activity can be beneficial. Watch cholesterol and blood lipids Have your blood tested for lipids and cholesterol at 53 years of age, then have this test every 5 years. You may need to have your cholesterol levels checked more often if:  Your lipid or cholesterol levels are high.  You are older than 53 years of age.  You are at high risk for heart disease. What should I know about cancer screening? Many types of cancers can be detected early and may often be prevented. Depending on your health history and family history, you may need to have cancer screening at various ages. This may include screening for:  Colorectal cancer.  Prostate cancer.  Skin cancer.  Lung  cancer. What should I know about heart disease, diabetes, and high blood pressure? Blood pressure and heart disease  High blood pressure causes heart disease and increases the risk of stroke. This is more likely to develop in people who have high blood pressure readings, are of African  descent, or are overweight.  Talk with your health care provider about your target blood pressure readings.  Have your blood pressure checked: ? Every 3-5 years if you are 3-10 years of age. ? Every year if you are 64 years old or older.  If you are between the ages of 15 and 57 and are a current or former smoker, ask your health care provider if you should have a one-time screening for abdominal aortic aneurysm (AAA). Diabetes Have regular diabetes screenings. This checks your fasting blood sugar level. Have the screening done:  Once every three years after age 8 if you are at a normal weight and have a low risk for diabetes.  More often and at a younger age if you are overweight or have a high risk for diabetes. What should I know about preventing infection? Hepatitis B If you have a higher risk for hepatitis B, you should be screened for this virus. Talk with your health care provider to find out if you are at risk for hepatitis B infection. Hepatitis C Blood testing is recommended for:  Everyone born from [birth year] through 1964.  Anyone with known risk factors for hepatitis C. Sexually transmitted infections (STIs)  You should be screened each year for STIs, including gonorrhea and chlamydia, if: ? You are sexually active and are younger than 53 years of age. ? You are older than 53 years of age and your health care provider tells you that you are at risk for this type of infection. ? Your sexual activity has changed since you were last screened, and you are at increased risk for chlamydia or gonorrhea. Ask your health care provider if you are at risk.  Ask your health care provider about whether you are at high risk for HIV. Your health care provider may recommend a prescription medicine to help prevent HIV infection. If you choose to take medicine to prevent HIV, you should first get tested for HIV. You should then be tested every 3 months for as long as you are taking the  medicine. Follow these instructions at home: Lifestyle  Do not use any products that contain nicotine or tobacco, such as cigarettes, e-cigarettes, and chewing tobacco. If you need help quitting, ask your health care provider.  Do not use street drugs.  Do not share needles.  Ask your health care provider for help if you need support or information about quitting drugs. Alcohol use  Do not drink alcohol if your health care provider tells you not to drink.  If you drink alcohol: ? Limit how much you have to 0-2 drinks a day. ? Be aware of how much alcohol is in your drink. In the [place] one drink equals one 12 oz bottle of beer (355 mL), one 5 oz glass of wine (148 mL), or one 1 oz glass of hard liquor (44 mL). General instructions  Schedule regular health, dental, and eye exams.  Stay current with your vaccines.  Tell your health care provider if: ? You often feel depressed. ? You have ever been abused or do not feel safe at home. Summary  Adopting a healthy lifestyle and getting preventive care are important in promoting health and wellness.  Follow your health care provider's instructions about healthy diet, exercising, and getting tested or screened for diseases.  Follow your health care provider's instructions on monitoring your cholesterol and blood pressure. This information is not intended to replace advice given to you by your health care provider. Make sure you discuss any questions you have with your health care provider. Document Revised: 05/29/2018 Document Reviewed: 05/29/2018 Elsevier Patient Education  2020 [place] Reynard Christoffersen M.D.

## 2019-12-19 ENCOUNTER — Encounter: Payer: Self-pay | Admitting: Internal Medicine

## 2019-12-19 ENCOUNTER — Other Ambulatory Visit: Payer: Self-pay

## 2019-12-19 ENCOUNTER — Ambulatory Visit (INDEPENDENT_AMBULATORY_CARE_PROVIDER_SITE_OTHER): Payer: 59 | Admitting: Internal Medicine

## 2019-12-19 VITALS — BP 116/82 | HR 95 | Temp 98.2°F | Ht 69.75 in | Wt 195.8 lb

## 2019-12-19 DIAGNOSIS — Z87442 Personal history of urinary calculi: Secondary | ICD-10-CM

## 2019-12-19 DIAGNOSIS — Z1211 Encounter for screening for malignant neoplasm of colon: Secondary | ICD-10-CM | POA: Diagnosis not present

## 2019-12-19 DIAGNOSIS — Z125 Encounter for screening for malignant neoplasm of prostate: Secondary | ICD-10-CM | POA: Diagnosis not present

## 2019-12-19 DIAGNOSIS — R351 Nocturia: Secondary | ICD-10-CM

## 2019-12-19 DIAGNOSIS — Z0000 Encounter for general adult medical examination without abnormal findings: Secondary | ICD-10-CM

## 2019-12-19 LAB — POC URINALSYSI DIPSTICK (AUTOMATED)
Bilirubin, UA: NEGATIVE
Blood, UA: NEGATIVE
Glucose, UA: NEGATIVE
Ketones, UA: NEGATIVE
Leukocytes, UA: NEGATIVE
Nitrite, UA: NEGATIVE
Protein, UA: POSITIVE — AB
Spec Grav, UA: >=1.03 — AB (ref 1.010–1.025)
Urobilinogen, UA: 0.2 E.U./dL
pH, UA: 6 (ref 5.0–8.0)

## 2019-12-19 LAB — CBC WITH DIFFERENTIAL/PLATELET
Basophils Absolute: 0 10*3/uL (ref 0.0–0.1)
Basophils Relative: 0.8 % (ref 0.0–3.0)
Eosinophils Absolute: 0 10*3/uL (ref 0.0–0.7)
Eosinophils Relative: 0.9 % (ref 0.0–5.0)
HCT: 45.9 % (ref 39.0–52.0)
Hemoglobin: 15.9 g/dL (ref 13.0–17.0)
Lymphocytes Relative: 23.1 % (ref 12.0–46.0)
Lymphs Abs: 1.2 10*3/uL (ref 0.7–4.0)
MCHC: 34.6 g/dL (ref 30.0–36.0)
MCV: 91.6 fl (ref 78.0–100.0)
Monocytes Absolute: 0.4 10*3/uL (ref 0.1–1.0)
Monocytes Relative: 7.6 % (ref 3.0–12.0)
Neutro Abs: 3.5 10*3/uL (ref 1.4–7.7)
Neutrophils Relative %: 67.6 % (ref 43.0–77.0)
Platelets: 193 10*3/uL (ref 150.0–400.0)
RBC: 5.02 Mil/uL (ref 4.22–5.81)
RDW: 13.1 % (ref 11.5–15.5)
WBC: 5.1 10*3/uL (ref 4.0–10.5)

## 2019-12-19 LAB — PSA: PSA: 1.69 ng/mL (ref 0.10–4.00)

## 2019-12-19 LAB — BASIC METABOLIC PANEL
BUN: 20 mg/dL (ref 6–23)
CO2: 30 mEq/L (ref 19–32)
Calcium: 9.7 mg/dL (ref 8.4–10.5)
Chloride: 104 mEq/L (ref 96–112)
Creatinine, Ser: 1.1 mg/dL (ref 0.40–1.50)
GFR: 70.05 mL/min (ref >60.00)
Glucose, Bld: 104 mg/dL — ABNORMAL HIGH (ref 70–99)
Potassium: 4.6 mEq/L (ref 3.5–5.1)
Sodium: 140 mEq/L (ref 135–145)

## 2019-12-19 LAB — LIPID PANEL
Cholesterol: 150 mg/dL (ref 0–200)
HDL: 41.1 mg/dL (ref >39.00)
LDL Cholesterol: 99 mg/dL (ref 0–99)
NonHDL: 108.88
Total CHOL/HDL Ratio: 4
Triglycerides: 47 mg/dL (ref 0.0–149.0)
VLDL: 9.4 mg/dL (ref 0.0–40.0)

## 2019-12-19 LAB — HEPATIC FUNCTION PANEL
ALT: 25 U/L (ref 0–53)
AST: 19 U/L (ref 0–37)
Albumin: 4.5 g/dL (ref 3.5–5.2)
Alkaline Phosphatase: 72 U/L (ref 39–117)
Bilirubin, Direct: 0.2 mg/dL (ref 0.0–0.3)
Total Bilirubin: 0.8 mg/dL (ref 0.2–1.2)
Total Protein: 7.1 g/dL (ref 6.0–8.3)

## 2019-12-19 LAB — HEMOGLOBIN A1C: Hgb A1c MFr Bld: 5.1 % (ref 4.6–6.5)

## 2019-12-19 LAB — TSH: TSH: 1.54 u[IU]/mL (ref 0.35–4.50)

## 2019-12-19 NOTE — Patient Instructions (Addendum)
Can make nurse appt for update tetanus booster ( td) doesn't mean   You will have same reaction.   Will notify you  of labs when available.   Colon cancer screening. Planned  . Get eye check .      Health Maintenance, Male Adopting a healthy lifestyle and getting preventive care are important in promoting health and wellness. Ask your health care provider about:  The right schedule for you to have regular tests and exams.  Things you can do on your own to prevent diseases and keep yourself healthy. What should I know about diet, weight, and exercise? Eat a healthy diet   Eat a diet that includes plenty of vegetables, fruits, low-fat dairy products, and lean protein.  Do not eat a lot of foods that are high in solid fats, added sugars, or sodium. Maintain a healthy weight Body mass index (BMI) is a measurement that can be used to identify possible weight problems. It estimates body fat based on height and weight. Your health care provider can help determine your BMI and help you achieve or maintain a healthy weight. Get regular exercise Get regular exercise. This is one of the most important things you can do for your health. Most adults should:  Exercise for at least 150 minutes each week. The exercise should increase your heart rate and make you sweat (moderate-intensity exercise).  Do strengthening exercises at least twice a week. This is in addition to the moderate-intensity exercise.  Spend less time sitting. Even light physical activity can be beneficial. Watch cholesterol and blood lipids Have your blood tested for lipids and cholesterol at 53 years of age, then have this test every 5 years. You may need to have your cholesterol levels checked more often if:  Your lipid or cholesterol levels are high.  You are older than 53 years of age.  You are at high risk for heart disease. What should I know about cancer screening? Many types of cancers can be detected early and  may often be prevented. Depending on your health history and family history, you may need to have cancer screening at various ages. This may include screening for:  Colorectal cancer.  Prostate cancer.  Skin cancer.  Lung cancer. What should I know about heart disease, diabetes, and high blood pressure? Blood pressure and heart disease  High blood pressure causes heart disease and increases the risk of stroke. This is more likely to develop in people who have high blood pressure readings, are of African descent, or are overweight.  Talk with your health care provider about your target blood pressure readings.  Have your blood pressure checked: ? Every 3-5 years if you are 26-22 years of age. ? Every year if you are 46 years old or older.  If you are between the ages of 1 and 71 and are a current or former smoker, ask your health care provider if you should have a one-time screening for abdominal aortic aneurysm (AAA). Diabetes Have regular diabetes screenings. This checks your fasting blood sugar level. Have the screening done:  Once every three years after age 32 if you are at a normal weight and have a low risk for diabetes.  More often and at a younger age if you are overweight or have a high risk for diabetes. What should I know about preventing infection? Hepatitis B If you have a higher risk for hepatitis B, you should be screened for this virus. Talk with your health care provider  to find out if you are at risk for hepatitis B infection. Hepatitis C Blood testing is recommended for:  Everyone born from [birth year] through 1964.  Anyone with known risk factors for hepatitis C. Sexually transmitted infections (STIs)  You should be screened each year for STIs, including gonorrhea and chlamydia, if: ? You are sexually active and are younger than 53 years of age. ? You are older than 53 years of age and your health care provider tells you that you are at risk for this type of  infection. ? Your sexual activity has changed since you were last screened, and you are at increased risk for chlamydia or gonorrhea. Ask your health care provider if you are at risk.  Ask your health care provider about whether you are at high risk for HIV. Your health care provider may recommend a prescription medicine to help prevent HIV infection. If you choose to take medicine to prevent HIV, you should first get tested for HIV. You should then be tested every 3 months for as long as you are taking the medicine. Follow these instructions at home: Lifestyle  Do not use any products that contain nicotine or tobacco, such as cigarettes, e-cigarettes, and chewing tobacco. If you need help quitting, ask your health care provider.  Do not use street drugs.  Do not share needles.  Ask your health care provider for help if you need support or information about quitting drugs. Alcohol use  Do not drink alcohol if your health care provider tells you not to drink.  If you drink alcohol: ? Limit how much you have to 0-2 drinks a day. ? Be aware of how much alcohol is in your drink. In the [place] one drink equals one 12 oz bottle of beer (355 mL), one 5 oz glass of wine (148 mL), or one 1 oz glass of hard liquor (44 mL). General instructions  Schedule regular health, dental, and eye exams.  Stay current with your vaccines.  Tell your health care provider if: ? You often feel depressed. ? You have ever been abused or do not feel safe at home. Summary  Adopting a healthy lifestyle and getting preventive care are important in promoting health and wellness.  Follow your health care provider's instructions about healthy diet, exercising, and getting tested or screened for diseases.  Follow your health care provider's instructions on monitoring your cholesterol and blood pressure. This information is not intended to replace advice given to you by your health care provider. Make sure you  discuss any questions you have with your health care provider. Document Revised: 05/29/2018 Document Reviewed: 05/29/2018 Elsevier Patient Education  2020 [employer].

## 2019-12-19 NOTE — Progress Notes (Signed)
Blood results nl  except   trace of protein screen in urine( that can be nothing important ) Blood sugar is borderline but no diabetes   Megan please Arrange  another urine sample  to be sure all is ok  :urine for  micro albumin /creatinine ratio.

## 2019-12-23 ENCOUNTER — Other Ambulatory Visit: Payer: Self-pay

## 2019-12-23 DIAGNOSIS — R809 Proteinuria, unspecified: Secondary | ICD-10-CM

## 2019-12-25 ENCOUNTER — Other Ambulatory Visit: Payer: 59

## 2019-12-25 DIAGNOSIS — R809 Proteinuria, unspecified: Secondary | ICD-10-CM

## 2019-12-25 LAB — MICROALBUMIN / CREATININE URINE RATIO
Creatinine,U: 196.2 mg/dL
Microalb Creat Ratio: 6.3 mg/g (ref 0.0–30.0)
Microalb, Ur: 12.4 mg/dL — ABNORMAL HIGH (ref 0.0–1.9)

## 2019-12-25 NOTE — Progress Notes (Signed)
Urine protein ratio is normal  this is good . No other testingneeded for this

## 2019-12-26 ENCOUNTER — Encounter: Payer: Self-pay | Admitting: Gastroenterology

## 2020-01-06 IMAGING — CT CT RENAL STONE PROTOCOL
2 of 4 series · 16 of 46 positions shown, 18 images · non-contrast
Comparison: 09/12/2008, 06/09/2009

CLINICAL DATA: Right flank pain

EXAM:
CT ABDOMEN AND PELVIS WITHOUT CONTRAST
TECHNIQUE: Multidetector CT imaging of the abdomen and pelvis was performed
following the standard protocol without IV contrast.

[Series 2: axial st · axial · 0.78mm/px · z∈[-486,-41]mm · 13 of 99 slices shown, 15 images]
[im 5/99  soft-tissue]
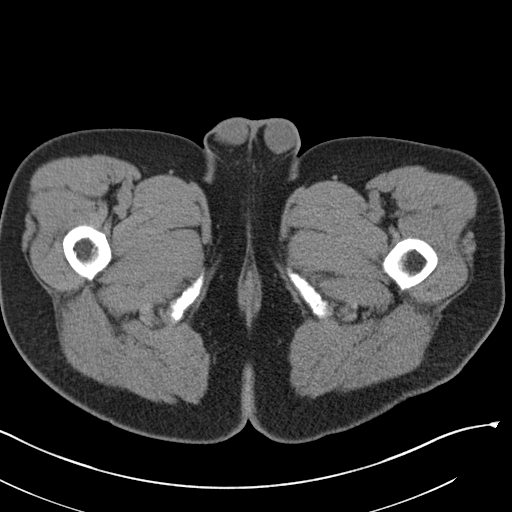
[im 5/99  bone]
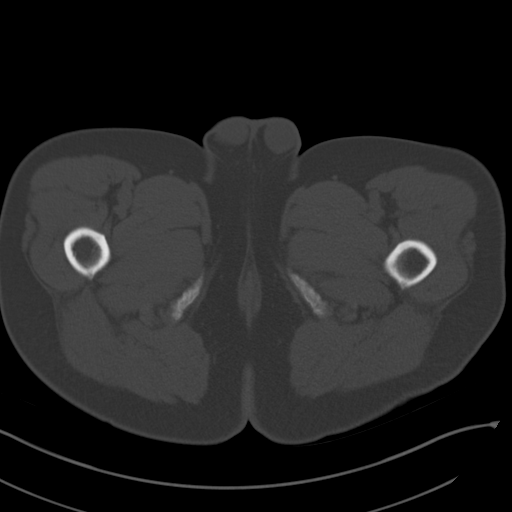
[im 15/99  soft-tissue]
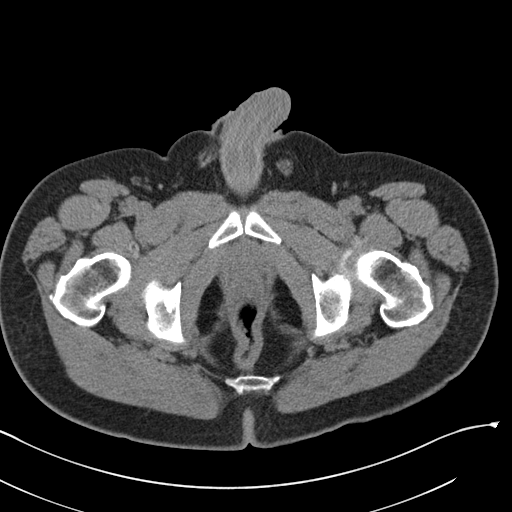
[im 19/99  soft-tissue]
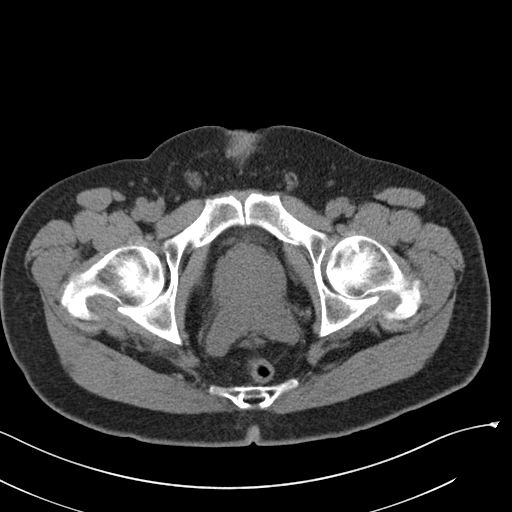
[im 29/99  soft-tissue]
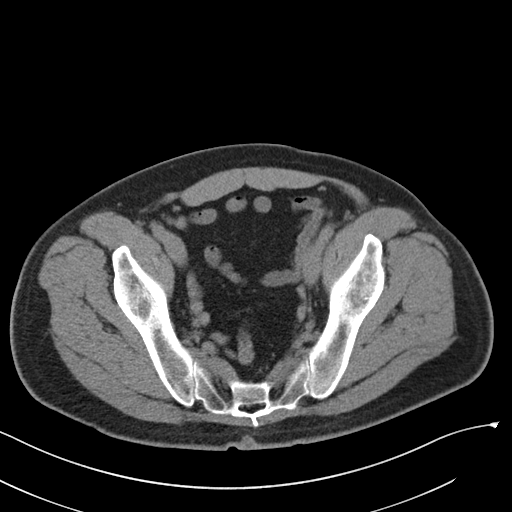
[im 33/99  soft-tissue]
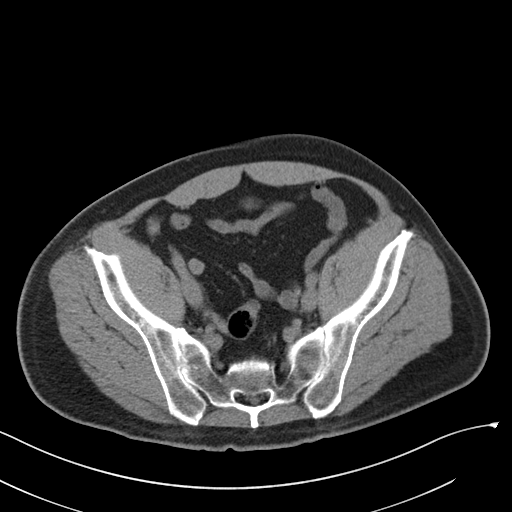
[im 43/99  soft-tissue]
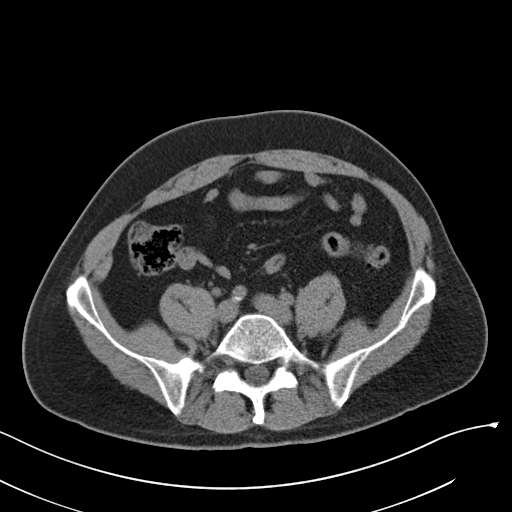
[im 52/99  soft-tissue]
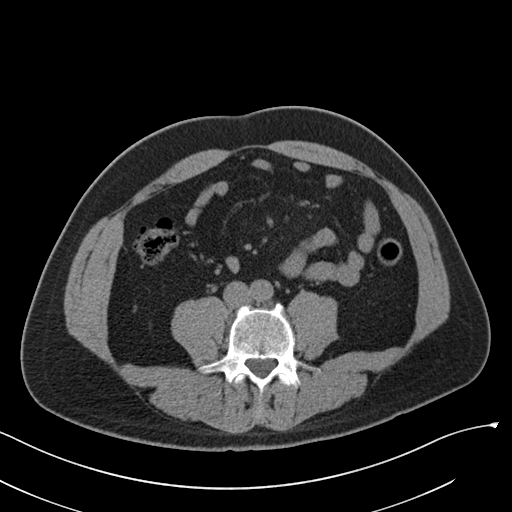
[im 57/99  soft-tissue]
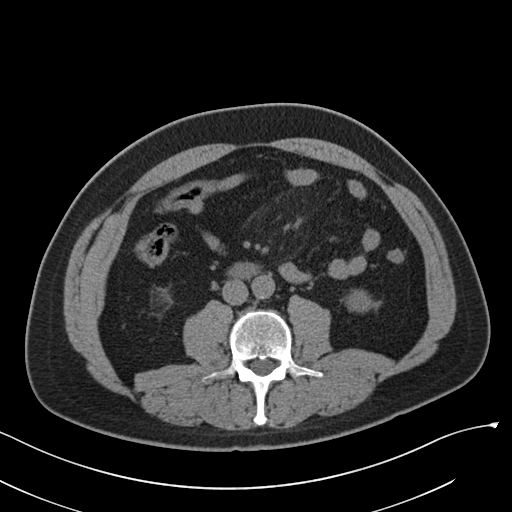
[im 66/99  soft-tissue]
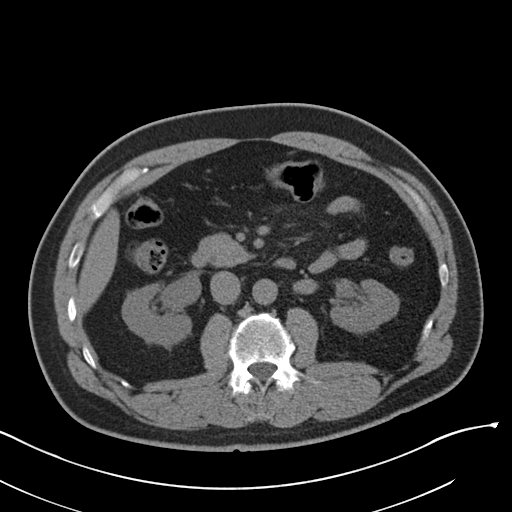
[im 66/99  bone]
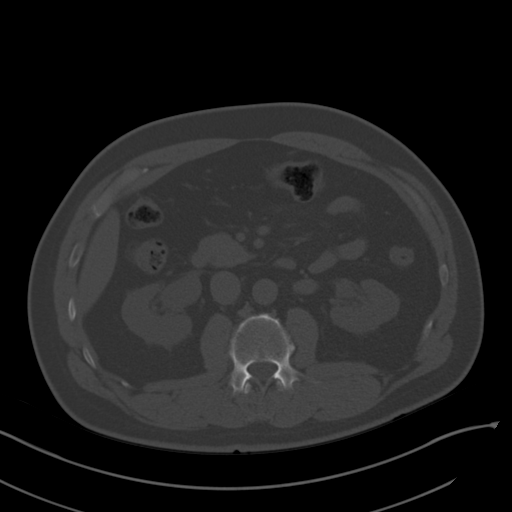
[im 71/99  soft-tissue]
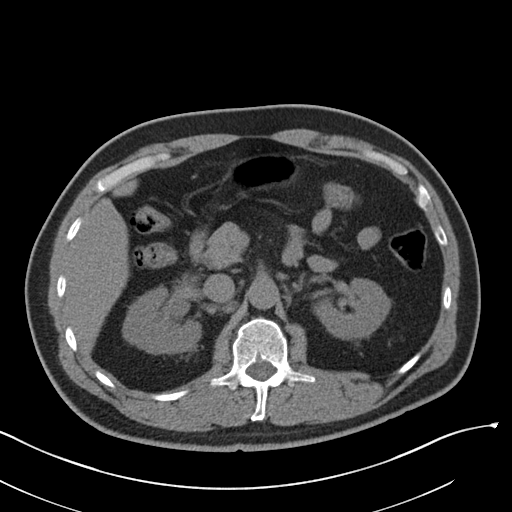
[im 80/99  soft-tissue]
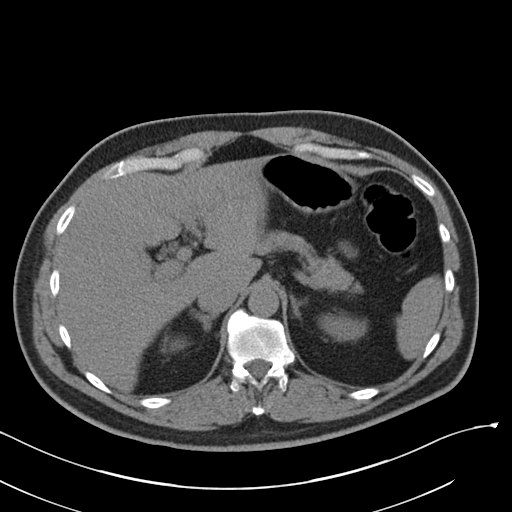
[im 85/99  soft-tissue]
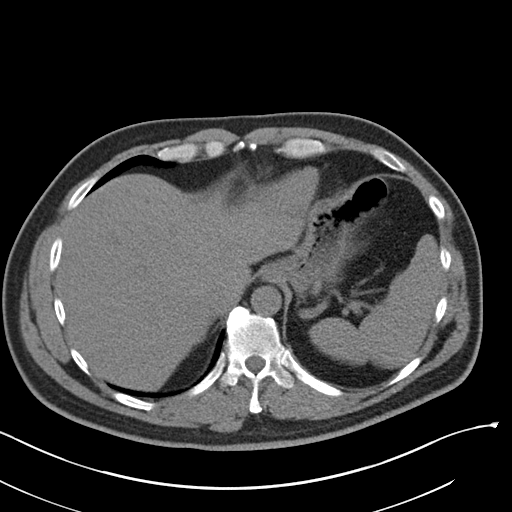
[im 94/99  soft-tissue]
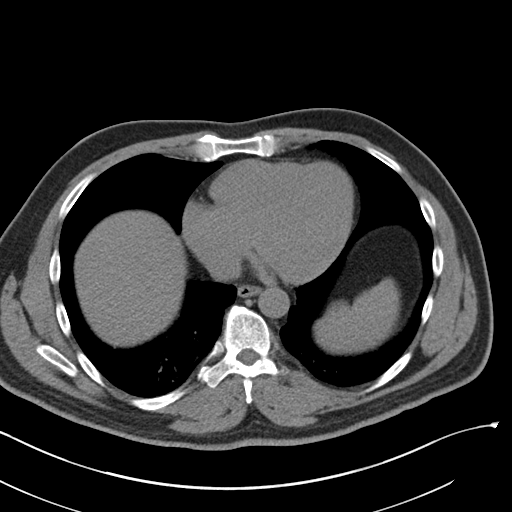

[Series 5: coronal · coronal · 0.76mm/px · 3 of 144 slices shown]
[im 48/144  soft-tissue]
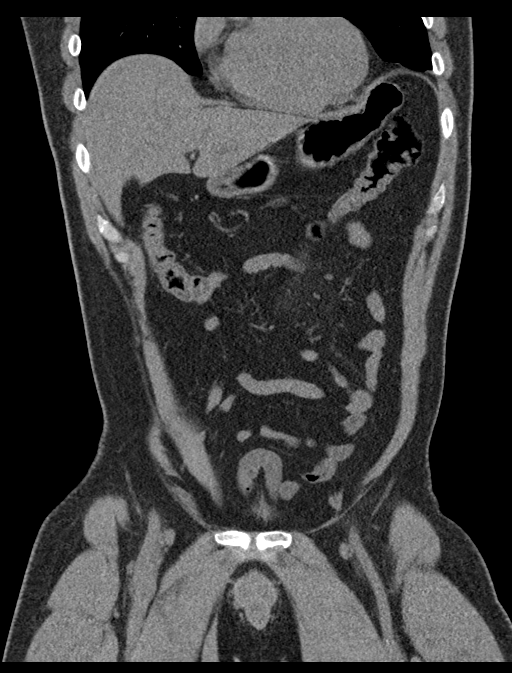
[im 64/144  soft-tissue]
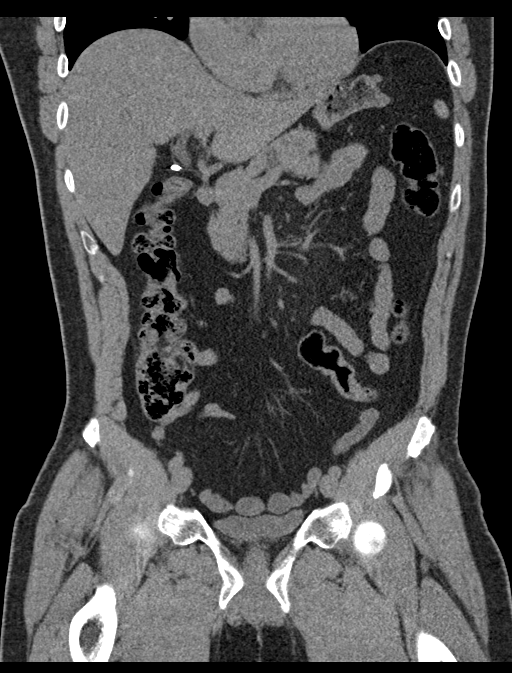
[im 80/144  soft-tissue]
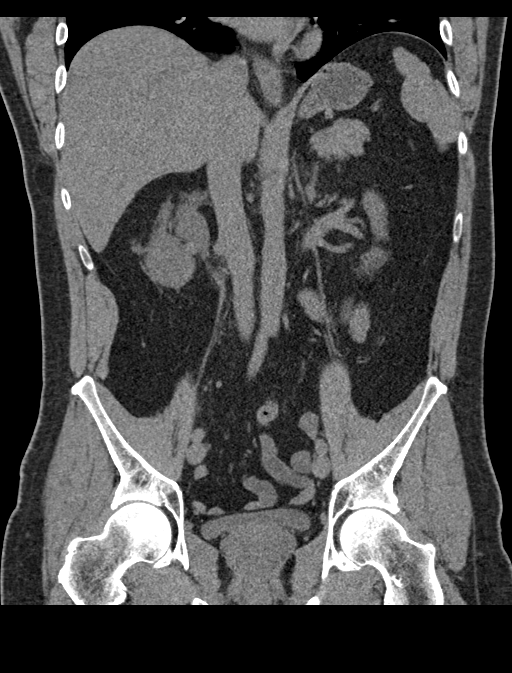

[16 of 46 positions shown; findings below may reference images not displayed]

FINDINGS: Lower chest: No acute abnormality.

Hepatobiliary: No focal liver abnormality is seen. Status post
cholecystectomy. No biliary dilatation.

Pancreas: Unremarkable. No pancreatic ductal dilatation or
surrounding inflammatory changes.

Spleen: Normal in size without focal abnormality.

Adrenals/Urinary Tract: Adrenal glands are normal. Mild right
hydronephrosis and hydroureter, secondary to a punctate 1-2 mm stone
at or just past the right UVJ.

Stomach/Bowel: Stomach is within normal limits. Appendix appears
normal. No evidence of bowel wall thickening, distention, or
inflammatory changes.

Vascular/Lymphatic: No significant vascular findings are present. No
enlarged abdominal or pelvic lymph nodes.

Reproductive: Prostate is prominent in size.

Other: No abdominal wall hernia or abnormality. No abdominopelvic
ascites. Small fat in the umbilical region. Mild hazy infiltration
of the central mesentery.

Musculoskeletal: No acute or significant osseous findings.
IMPRESSION: 1. Mild right hydronephrosis and hydroureter, secondary to a
punctate 1-2 mm stone at or just past the right UVJ.
2. No other acute abnormalities are visualized.

## 2020-02-27 ENCOUNTER — Ambulatory Visit (AMBULATORY_SURGERY_CENTER): Payer: Self-pay | Admitting: *Deleted

## 2020-02-27 ENCOUNTER — Encounter: Payer: Self-pay | Admitting: Gastroenterology

## 2020-02-27 ENCOUNTER — Other Ambulatory Visit: Payer: Self-pay

## 2020-02-27 VITALS — Ht 69.75 in | Wt 199.0 lb

## 2020-02-27 DIAGNOSIS — Z1211 Encounter for screening for malignant neoplasm of colon: Secondary | ICD-10-CM

## 2020-02-27 MED ORDER — PLENVU 140 G PO SOLR: 1.0000 | Freq: Once | ORAL | 0 refills | Status: AC

## 2020-02-27 NOTE — Progress Notes (Signed)
Patient and wife is here in-person for PV. Patient denies any allergies to eggs or soy. Patient denies any problems with anesthesia/sedation. Patient denies any oxygen use at home. Patient denies taking any diet/weight loss medications or blood thinners. Patient is not being treated for MRSA or C-diff. Patient is aware of our care-partner policy and [id number] safety protocol. EMMI education assisgned to the patient for the procedure, sent to MyChart.   COVID-19 vaccines completed on 10/10/19, per patient.   Prep Prescription coupon given to the patient.

## 2020-03-12 ENCOUNTER — Encounter: Payer: Self-pay | Admitting: Gastroenterology

## 2020-03-12 ENCOUNTER — Ambulatory Visit (AMBULATORY_SURGERY_CENTER): Payer: 59 | Admitting: Gastroenterology

## 2020-03-12 ENCOUNTER — Other Ambulatory Visit: Payer: Self-pay

## 2020-03-12 VITALS — BP 122/78 | HR 73 | Temp 98.4°F | Resp 13 | Ht 69.0 in | Wt 199.0 lb

## 2020-03-12 DIAGNOSIS — K621 Rectal polyp: Secondary | ICD-10-CM | POA: Diagnosis not present

## 2020-03-12 DIAGNOSIS — D128 Benign neoplasm of rectum: Secondary | ICD-10-CM

## 2020-03-12 DIAGNOSIS — Z1211 Encounter for screening for malignant neoplasm of colon: Secondary | ICD-10-CM | POA: Diagnosis not present

## 2020-03-12 DIAGNOSIS — K6282 Dysplasia of anus: Secondary | ICD-10-CM

## 2020-03-12 MED ORDER — SODIUM CHLORIDE 0.9 % IV SOLN: 500.0000 mL | Freq: Once | INTRAVENOUS | Status: DC

## 2020-03-12 NOTE — Progress Notes (Signed)
Vital signs checked by: SP  The patient states no changes in medical or surgical history since pre-visit screening on 02/27/20.

## 2020-03-12 NOTE — Progress Notes (Signed)
Called to room to assist during endoscopic procedure.  Patient ID and intended procedure confirmed with present staff. Received instructions for my participation in the procedure from the performing physician.  

## 2020-03-12 NOTE — Patient Instructions (Addendum)
Handouts Provided:  Polyps and Hemorrhoids You may resume your current medications today. NO aspirin, ibuprofen, naproxen, or other non-steroidal anti-inflammatory drugs for 2 weeks after polyp removal.  Await biopsy results. Please call if any questions or concerns.   YOU HAD AN ENDOSCOPIC PROCEDURE TODAY AT [facility]:   Refer to the procedure report that was given to you for any specific questions about what was found during the examination.  If the procedure report does not answer your questions, please call your gastroenterologist to clarify.  If you requested that your care partner not be given the details of your procedure findings, then the procedure report has been included in a sealed envelope for you to review at your convenience later.  YOU SHOULD EXPECT: Some feelings of bloating in the abdomen. Passage of more gas than usual.  Walking can help get rid of the air that was put into your GI tract during the procedure and reduce the bloating. If you had a lower endoscopy (such as a colonoscopy or flexible sigmoidoscopy) you may notice spotting of blood in your stool or on the toilet paper. If you underwent a bowel prep for your procedure, you may not have a normal bowel movement for a few days.  Please Note:  You might notice some irritation and congestion in your nose or some drainage.  This is from the oxygen used during your procedure.  There is no need for concern and it should clear up in a day or so.  SYMPTOMS TO REPORT IMMEDIATELY:   Following lower endoscopy (colonoscopy or flexible sigmoidoscopy):  Excessive amounts of blood in the stool  Significant tenderness or worsening of abdominal pains  Swelling of the abdomen that is new, acute  Fever of 100F or higher   For urgent or emergent issues, a gastroenterologist can be reached at any hour by calling [phone]. Do not use MyChart messaging for urgent concerns.    DIET:  We do recommend a small  meal at first, but then you may proceed to your regular diet.  Drink plenty of fluids but you should avoid alcoholic beverages for 24 hours.  ACTIVITY:  You should plan to take it easy for the rest of today and you should NOT DRIVE or use heavy machinery until tomorrow (because of the sedation medicines used during the test).    FOLLOW UP: Our staff will call the number listed on your records 48-72 hours following your procedure to check on you and address any questions or concerns that you may have regarding the information given to you following your procedure. If we do not reach you, we will leave a message.  We will attempt to reach you two times.  During this call, we will ask if you have developed any symptoms of COVID 19. If you develop any symptoms (ie: fever, flu-like symptoms, shortness of breath, cough etc.) before then, please call [phone].  If you test positive for Covid 19 in the 2 weeks post procedure, please call and report this information to [place].    If any biopsies were taken you will be contacted by phone or by letter within the next 1-3 weeks.  Please call us at [phone] if you have not heard about the biopsies in 3 weeks.    SIGNATURES/CONFIDENTIALITY: You and/or your care partner have signed paperwork which will be entered into your electronic medical record.  These signatures attest to the fact that that the information above on your After Visit  Summary has been reviewed and is understood.  Full responsibility of the confidentiality of this discharge information lies with you and/or your care-partner.

## 2020-03-12 NOTE — Progress Notes (Signed)
To PACU< VSS. Report to [email or web address] 

## 2020-03-12 NOTE — Progress Notes (Signed)
No problems noted in the recovery room. maw 

## 2020-03-12 NOTE — Op Note (Signed)
[facility] Patient Name: Kenneth [name] Procedure Date: 03/12/2020 11:55 AM MRN: [id number] Endoscopist: Ladene Artist , MD Age: 54 Referring MD:  Date of Birth: 1966/01/02 Gender: Male Account #: [id number] Procedure:                Colonoscopy Indications:              Screening for colorectal malignant neoplasm Medicines:                Monitored Anesthesia Care Procedure:                Pre-Anesthesia Assessment:                           - Prior to the procedure, a History and Physical                            was performed, and patient medications and                            allergies were reviewed. The patient's tolerance of                            previous anesthesia was also reviewed. The risks                            and benefits of the procedure and the sedation                            options and risks were discussed with the patient.                            All questions were answered, and informed consent                            was obtained. Prior Anticoagulants: The patient has                            taken no previous anticoagulant or antiplatelet                            agents. ASA Grade Assessment: II - A patient with                            mild systemic disease. After reviewing the risks                            and benefits, the patient was deemed in                            satisfactory condition to undergo the procedure.                           After obtaining informed consent, the colonoscope  was passed under direct vision. Throughout the                            procedure, the patient's blood pressure, pulse, and                            oxygen saturations were monitored continuously. The                            Colonoscope was introduced through the anus and                            advanced to the the cecum, identified by                            appendiceal orifice and  ileocecal valve. The                            ileocecal valve, appendiceal orifice, and rectum                            were photographed. The quality of the bowel                            preparation was excellent. The colonoscopy was                            performed without difficulty. The patient tolerated                            the procedure well. Scope In: 12:03:19 PM Scope Out: 12:20:12 PM Scope Withdrawal Time: 0 hours 14 minutes 55 seconds  Total Procedure Duration: 0 hours 16 minutes 53 seconds  Findings:                 The perianal and digital rectal examinations were                            normal.                           A 10 mm polyp was found in the distal rectum found                            on retroflexion. The distal margin on the polyp was                            at the dentate line. The polyp was sessile. The                            polyp was removed with a hot snare. Resection and                            retrieval were complete.  Internal hemorrhoids were found during                            retroflexion. The hemorrhoids were small and Grade                            I (internal hemorrhoids that do not prolapse).                           The exam was otherwise without abnormality on                            direct and retroflexion views. Complications:            No immediate complications. Estimated blood loss:                            None. Estimated Blood Loss:     Estimated blood loss: none. Impression:               - One 10 mm polyp in the distal rectum, removed                            with a hot snare. Resected and retrieved.                           - Internal hemorrhoids.                           - The examination was otherwise normal on direct                            and retroflexion views. Recommendation:           - Repeat colonoscopy date to be determined after                             pending pathology results are reviewed for                            surveillance based on pathology results.                           - Patient has a contact number available for                            emergencies. The signs and symptoms of potential                            delayed complications were discussed with the                            patient. Return to normal activities tomorrow.                            Written discharge instructions were provided to the  patient.                           - Resume previous diet.                           - Continue present medications.                           - Await pathology results.                           - No aspirin, ibuprofen, naproxen, or other                            non-steroidal anti-inflammatory drugs for 2 weeks                            after polyp removal. Ladene Artist, MD 03/12/2020 12:25:11 PM This report has been signed electronically.

## 2020-03-16 ENCOUNTER — Telehealth: Payer: Self-pay | Admitting: *Deleted

## 2020-03-16 NOTE — Telephone Encounter (Signed)
°  Follow up Call-  Call back number 03/12/2020  Post procedure Call Back phone  # [phone]  Permission to leave phone message Yes  Some recent data might be hidden     Patient questions:  Do you have a fever, pain , or abdominal swelling? No. Pain Score  0 *  Have you tolerated food without any problems? Yes.    Have you been able to return to your normal activities? Yes.    Do you have any questions about your discharge instructions: Diet   No. Medications  No. Follow up visit  No.  Do you have questions or concerns about your Care? No.  Actions: * If pain score is 4 or above: No action needed, pain <4.  1. Have you developed a fever since your procedure? no  2.   Have you had an respiratory symptoms (SOB or cough) since your procedure? no  3.   Have you tested positive for COVID 19 since your procedure no  4.   Have you had any family members/close contacts diagnosed with the COVID 19 since your procedure?  no   If yes to any of these questions please route to Joylene John, RN and Joella Prince, RN

## 2020-04-07 ENCOUNTER — Telehealth: Payer: Self-pay | Admitting: Gastroenterology

## 2020-04-07 NOTE — Telephone Encounter (Signed)
Dr. Fuller Plan.  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  Patient declined the referral .  He refused the consult when contacted by [facility]

## 2020-04-07 NOTE — Telephone Encounter (Signed)
Kenneth [name] with [facility] called to inform that pt declined appt for the referral that we sent.

## 2022-05-01 ENCOUNTER — Encounter: Payer: Self-pay | Admitting: Internal Medicine

## 2022-05-01 ENCOUNTER — Ambulatory Visit (INDEPENDENT_AMBULATORY_CARE_PROVIDER_SITE_OTHER): Payer: 59 | Admitting: Internal Medicine

## 2022-05-01 VITALS — BP 150/86 | HR 67 | Temp 98.3°F | Ht 70.0 in | Wt 197.0 lb

## 2022-05-01 DIAGNOSIS — L409 Psoriasis, unspecified: Secondary | ICD-10-CM

## 2022-05-01 DIAGNOSIS — R030 Elevated blood-pressure reading, without diagnosis of hypertension: Secondary | ICD-10-CM

## 2022-05-01 DIAGNOSIS — L608 Other nail disorders: Secondary | ICD-10-CM | POA: Diagnosis not present

## 2022-05-01 NOTE — Progress Notes (Signed)
Chief Complaint  Patient presents with   Nail Problem    Patient reports he was seen with [facility] provider for nail issues that has been going on for 12 years. He said the provider thinks the cause is from psoriasis and prescribe him Rmeumatrex. He would like a second opinion before taking the medication.     HPI: Kenneth [name] 56 y.o. come in spoke alos with wife on face time....for on going problem with nail defomity that has been present and somewhat should progressed over many years maybe 10 There is thickening and under breasts with raised has seen to dermatologist diagnosed as possible fungal infection treated with 2 different antifungals without success. Last dermatologist felt that it was caused by psoriasis although he has no skin or joint effects. The rheumatologist at the [facility] put him on or advised to try methotrexate.  He would like a second opinion about this because of the potential negative effects of this medication. ?  Asks if there is a blood test for psoriasis would help sort out the diagnosis.  He is generally well except for this but the nails have turned ingrowing at times needing procedures he states that his toenails of also done the same thing no known family history. Works with his [employer] situation but no specific toxins  Had a hand evaluation done in August that was unrevealing.  Last visit  PV  7 21  Had hand eval August 23  ROS: See pertinent positives and negatives per HPI.  No cardiovascular symptoms no known elevation of blood pressure when checked. Family history negative for psoriasis as far as he knows father had coronary disease and a stent.  Unknown if hypertension.  Past Medical History:  Diagnosis Date   GERD (gastroesophageal reflux disease)    Hyperlipidemia    Nephrolithiasis 08/2008   left    Family History  Problem Relation Age of Onset   Cancer Mother        eye   Colon polyps Brother    Stomach cancer Brother     Colon cancer Neg Hx    Esophageal cancer Neg Hx    Rectal cancer Neg Hx     Social History   Socioeconomic History   Marital status: Married    Spouse name: Not on file   Number of children: Not on file   Years of education: Not on file   Highest education level: Not on file  Occupational History   Not on file  Tobacco Use   Smoking status: Never   Smokeless tobacco: Never  Vaping Use   Vaping Use: Never used  Substance and Sexual Activity   Alcohol use: Yes    Alcohol/week: 2.0 standard drinks of alcohol    Types: 2 Cans of beer per week   Drug use: No   Sexual activity: Not on file  Other Topics Concern   Not on file  Social History Narrative   HH of 3   No Pets   7-8 hours of sleep   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   No facility-administered medications prior to visit.  EXAM:  BP (!) 150/86 (BP Location: Right Arm, Patient Position: Sitting, Cuff Size: Normal)   Pulse 67   Temp 98.3 F (36.8 C) (Oral)   Ht '5\' 10"'$  (1.778 m)   Wt 197 lb (89.4 kg)   SpO2 96%   BMI 28.27 kg/m   Body mass index is 28.27 kg/m.  GENERAL: vitals reviewed and listed above, alert, oriented, appears well hydrated and in no acute distress HEENT: atraumatic, conjunctiva  clear, no obvious abnormalities on inspection of external nose and ears   NECK: no obvious masses on inspection palpation  LUNGS: clear to auscultation bilaterally, no wheezes, rales or rhonchi, good air movement CV: HRRR, no clubbing cyanosis or  peripheral edema nl cap refill  MS: moves all extremities hands without deformity or redness but nails are all thickened with hyperkeratotic debris and some curling. Skin shows no  obvious rashes psoriatic plaques. PSYCH: pleasant and cooperative, no obvious depression or anxiety Lab Results  Component Value Date   WBC 5.1 12/19/2019   HGB 15.9 12/19/2019   HCT 45.9 12/19/2019   PLT 193.0 12/19/2019   GLUCOSE 104 (H) 12/19/2019   CHOL 150 12/19/2019   TRIG 47.0 12/19/2019   HDL 41.10 12/19/2019   LDLCALC 99 12/19/2019   ALT 25 12/19/2019   AST 19 12/19/2019   NA 140 12/19/2019   K 4.6 12/19/2019   CL 104 12/19/2019   CREATININE 1.10 12/19/2019   BUN 20 12/19/2019   CO2 30 12/19/2019   TSH 1.54 12/19/2019   PSA 1.69 12/19/2019   HGBA1C 5.1 12/19/2019   MICROALBUR 12.4 (H) 12/25/2019   BP Readings from Last 3 Encounters:  05/01/22 (!) 150/86  03/12/20 122/78  12/19/19 116/82  Disc   hx and plan with wife also  during visit   ASSESSMENT AND PLAN:  Discussed the following assessment and plan:  Nail deformity hands and feet hyperkeratotic - rx for fungal and had ? pos culutre  2 dermatologist  dx fungal vs psoriatic nail  would like second opinion as to cause dx and   rx options. - Plan: Ambulatory referral to Dermatology  Psoriasis of nail ? - Plan: Ambulatory referral to Dermatology  Elevated blood pressure reading - get fu readings and plan  rx if ongoing elevatoin Collect records from the 2-3 derm and rheum  and get tertiary care center  Derm opinion.   -Patient advised to return or notify health care team  if  new concerns arise.  Patient Instructions  Get records from all evaluations  including lab and culture tests.  [facility]   I will do referral.  If  You get joint swelling in interim we can also do rheumatology second opinion.   Take blood pressure readings twice a day for about 5 days days and record .     Take 2 -3 readings at each sitting .   Can send in readings  by My Chart.    Before checking your blood pressure make sure: You are seated and quite for 5 min before  checking Feet are flat on the floor Siting in chair with your back supported straight up and down Arm resting on table or arm of chair at heart level Bladder is empty You have NOT had caffeine or tobacco within the last 30 min  [email or web address]    [place] K. Trevor Duty M.D.

## 2022-05-01 NOTE — Patient Instructions (Addendum)
Get records from all evaluations  including lab and culture tests.  Then would  [facility]   I will do referral.  If  You get joint swelling in interim we can also do rheumatology second opinion.   Take blood pressure readings twice a day for about 5 days days and record .     Take 2 -3 readings at each sitting .   Can send in readings  by My Chart.    Before checking your blood pressure make sure: You are seated and quite for 5 min before checking Feet are flat on the floor Siting in chair with your back supported straight up and down Arm resting on table or arm of chair at heart level Bladder is empty You have NOT had caffeine or tobacco within the last 30 min  [email or web address]

## 2022-05-16 ENCOUNTER — Encounter: Payer: Self-pay | Admitting: Internal Medicine

## 2022-05-17 NOTE — Telephone Encounter (Signed)
Thanks for the update   readings look acceptable

## 2023-06-26 ENCOUNTER — Telehealth: Payer: Self-pay | Admitting: Internal Medicine

## 2023-06-26 NOTE — Telephone Encounter (Signed)
 Called pt to sch physical, left vm. Pls sch pt for appt.
